# Patient Record
Sex: Male | Born: 1935 | Race: White | Hispanic: No | Marital: Married
Health system: Southern US, Community
[De-identification: ages and names within clinical notes are randomized; demographics above are authoritative.]

---

## 1999-08-10 ENCOUNTER — Ambulatory Visit (HOSPITAL_COMMUNITY): Admission: RE | Admit: 1999-08-10 | Discharge: 1999-08-10 | Payer: Self-pay | Admitting: *Deleted

## 1999-08-16 ENCOUNTER — Encounter: Payer: Self-pay | Admitting: *Deleted

## 1999-08-16 ENCOUNTER — Ambulatory Visit (HOSPITAL_COMMUNITY): Admission: RE | Admit: 1999-08-16 | Discharge: 1999-08-16 | Payer: Self-pay | Admitting: *Deleted

## 2004-01-12 ENCOUNTER — Ambulatory Visit: Payer: Self-pay | Admitting: Internal Medicine

## 2004-01-13 ENCOUNTER — Ambulatory Visit: Payer: Self-pay | Admitting: Internal Medicine

## 2004-02-04 ENCOUNTER — Encounter: Payer: Self-pay | Admitting: Internal Medicine

## 2004-02-16 ENCOUNTER — Encounter: Admission: RE | Admit: 2004-02-16 | Discharge: 2004-02-16 | Payer: Self-pay | Admitting: Urology

## 2004-06-15 ENCOUNTER — Encounter: Payer: Self-pay | Admitting: Internal Medicine

## 2004-06-29 ENCOUNTER — Ambulatory Visit: Payer: Self-pay | Admitting: Internal Medicine

## 2004-07-23 ENCOUNTER — Ambulatory Visit: Payer: Self-pay | Admitting: Internal Medicine

## 2005-03-01 ENCOUNTER — Ambulatory Visit: Payer: Self-pay | Admitting: Internal Medicine

## 2005-03-17 ENCOUNTER — Ambulatory Visit: Payer: Self-pay | Admitting: Internal Medicine

## 2006-02-06 ENCOUNTER — Encounter (INDEPENDENT_AMBULATORY_CARE_PROVIDER_SITE_OTHER): Payer: Self-pay | Admitting: *Deleted

## 2006-05-16 ENCOUNTER — Encounter: Payer: Self-pay | Admitting: Internal Medicine

## 2006-08-24 ENCOUNTER — Ambulatory Visit: Payer: Self-pay | Admitting: Internal Medicine

## 2006-08-24 DIAGNOSIS — R634 Abnormal weight loss: Secondary | ICD-10-CM

## 2006-08-24 DIAGNOSIS — I10 Essential (primary) hypertension: Secondary | ICD-10-CM | POA: Insufficient documentation

## 2006-08-24 DIAGNOSIS — E785 Hyperlipidemia, unspecified: Secondary | ICD-10-CM

## 2006-08-24 DIAGNOSIS — E119 Type 2 diabetes mellitus without complications: Secondary | ICD-10-CM

## 2006-08-24 DIAGNOSIS — F528 Other sexual dysfunction not due to a substance or known physiological condition: Secondary | ICD-10-CM

## 2006-08-24 DIAGNOSIS — I2510 Atherosclerotic heart disease of native coronary artery without angina pectoris: Secondary | ICD-10-CM | POA: Insufficient documentation

## 2006-08-25 ENCOUNTER — Ambulatory Visit: Payer: Self-pay | Admitting: Internal Medicine

## 2006-08-26 ENCOUNTER — Encounter: Payer: Self-pay | Admitting: Internal Medicine

## 2006-08-28 LAB — CONVERTED CEMR LAB
ALT: 13 units/L (ref 0–53)
AST: 22 units/L (ref 0–37)
Basophils Absolute: 0 10*3/uL (ref 0.0–0.1)
Chloride: 104 meq/L (ref 96–112)
Cholesterol: 124 mg/dL (ref 0–200)
Creatinine, Ser: 1.1 mg/dL (ref 0.4–1.5)
Eosinophils Relative: 2.4 % (ref 0.0–5.0)
Glucose, Bld: 137 mg/dL — ABNORMAL HIGH (ref 70–99)
HCT: 46.6 % (ref 39.0–52.0)
Hemoglobin: 15.3 g/dL (ref 13.0–17.0)
Hgb A1c MFr Bld: 6.4 % — ABNORMAL HIGH (ref 4.6–6.0)
LDL Cholesterol: 77 mg/dL (ref 0–99)
MCHC: 32.9 g/dL (ref 30.0–36.0)
MCV: 92.8 fL (ref 78.0–100.0)
Monocytes Absolute: 0.5 10*3/uL (ref 0.2–0.7)
Neutrophils Relative %: 57.2 % (ref 43.0–77.0)
Potassium: 4.4 meq/L (ref 3.5–5.1)
RBC: 5.02 M/uL (ref 4.22–5.81)
RDW: 12.5 % (ref 11.5–14.6)
Sodium: 144 meq/L (ref 135–145)
TSH: 1.65 microintl units/mL (ref 0.35–5.50)
Total CHOL/HDL Ratio: 3.6
WBC: 5.4 10*3/uL (ref 4.5–10.5)

## 2006-09-06 ENCOUNTER — Telehealth (INDEPENDENT_AMBULATORY_CARE_PROVIDER_SITE_OTHER): Payer: Self-pay | Admitting: *Deleted

## 2006-10-05 ENCOUNTER — Telehealth (INDEPENDENT_AMBULATORY_CARE_PROVIDER_SITE_OTHER): Payer: Self-pay | Admitting: *Deleted

## 2006-10-18 ENCOUNTER — Ambulatory Visit: Payer: Self-pay | Admitting: Internal Medicine

## 2006-10-23 LAB — CONVERTED CEMR LAB
BUN: 20 mg/dL (ref 6–23)
Calcium: 9.2 mg/dL (ref 8.4–10.5)
GFR calc Af Amer: 84 mL/min
GFR calc non Af Amer: 70 mL/min
Potassium: 4.5 meq/L (ref 3.5–5.1)

## 2007-01-17 ENCOUNTER — Ambulatory Visit: Payer: Self-pay | Admitting: Internal Medicine

## 2007-01-17 DIAGNOSIS — C61 Malignant neoplasm of prostate: Secondary | ICD-10-CM | POA: Insufficient documentation

## 2007-01-22 LAB — CONVERTED CEMR LAB
AST: 19 units/L (ref 0–37)
Albumin: 4.1 g/dL (ref 3.5–5.2)
Alkaline Phosphatase: 76 units/L (ref 39–117)
Amylase: 87 units/L (ref 27–131)
Basophils Absolute: 0 10*3/uL (ref 0.0–0.1)
Basophils Relative: 0.3 % (ref 0.0–1.0)
CO2: 32 meq/L (ref 19–32)
Chloride: 101 meq/L (ref 96–112)
Creatinine, Ser: 1.2 mg/dL (ref 0.4–1.5)
Creatinine,U: 124.6 mg/dL
Eosinophils Relative: 2 % (ref 0.0–5.0)
HCT: 45.1 % (ref 39.0–52.0)
Hemoglobin: 15.4 g/dL (ref 13.0–17.0)
Iron: 82 ug/dL (ref 42–165)
MCHC: 34 g/dL (ref 30.0–36.0)
Neutrophils Relative %: 59.7 % (ref 43.0–77.0)
RBC: 4.91 M/uL (ref 4.22–5.81)
RDW: 12.6 % (ref 11.5–14.6)
Sodium: 141 meq/L (ref 135–145)
TSH: 1.7 microintl units/mL (ref 0.35–5.50)
Total Bilirubin: 0.9 mg/dL (ref 0.3–1.2)
Total Protein: 7.2 g/dL (ref 6.0–8.3)
Transferrin: 260.8 mg/dL (ref >=212.0)
WBC: 5.7 10*3/uL (ref 4.5–10.5)

## 2007-01-30 ENCOUNTER — Ambulatory Visit: Admission: RE | Admit: 2007-01-30 | Discharge: 2007-02-07 | Payer: Self-pay | Admitting: Radiation Oncology

## 2007-02-05 ENCOUNTER — Ambulatory Visit: Payer: Self-pay | Admitting: Internal Medicine

## 2007-02-05 ENCOUNTER — Telehealth (INDEPENDENT_AMBULATORY_CARE_PROVIDER_SITE_OTHER): Payer: Self-pay | Admitting: *Deleted

## 2007-02-08 ENCOUNTER — Ambulatory Visit: Admission: RE | Admit: 2007-02-08 | Discharge: 2007-05-09 | Payer: Self-pay | Admitting: Radiation Oncology

## 2007-02-09 ENCOUNTER — Telehealth (INDEPENDENT_AMBULATORY_CARE_PROVIDER_SITE_OTHER): Payer: Self-pay | Admitting: *Deleted

## 2007-02-09 ENCOUNTER — Encounter: Payer: Self-pay | Admitting: Internal Medicine

## 2007-02-22 ENCOUNTER — Telehealth: Payer: Self-pay | Admitting: Internal Medicine

## 2007-03-07 ENCOUNTER — Telehealth: Payer: Self-pay | Admitting: Internal Medicine

## 2007-03-23 ENCOUNTER — Encounter: Payer: Self-pay | Admitting: Internal Medicine

## 2007-03-29 ENCOUNTER — Ambulatory Visit (HOSPITAL_COMMUNITY): Admission: RE | Admit: 2007-03-29 | Discharge: 2007-03-29 | Payer: Self-pay | Admitting: Internal Medicine

## 2007-04-05 ENCOUNTER — Encounter: Payer: Self-pay | Admitting: Internal Medicine

## 2007-04-12 ENCOUNTER — Encounter: Payer: Self-pay | Admitting: Internal Medicine

## 2007-04-20 ENCOUNTER — Telehealth (INDEPENDENT_AMBULATORY_CARE_PROVIDER_SITE_OTHER): Payer: Self-pay | Admitting: *Deleted

## 2007-04-27 ENCOUNTER — Ambulatory Visit: Payer: Self-pay | Admitting: Internal Medicine

## 2007-04-27 DIAGNOSIS — F068 Other specified mental disorders due to known physiological condition: Secondary | ICD-10-CM

## 2007-04-30 ENCOUNTER — Encounter: Payer: Self-pay | Admitting: Internal Medicine

## 2007-05-10 ENCOUNTER — Ambulatory Visit: Admission: RE | Admit: 2007-05-10 | Discharge: 2007-06-24 | Payer: Self-pay | Admitting: Radiation Oncology

## 2007-07-03 ENCOUNTER — Encounter (INDEPENDENT_AMBULATORY_CARE_PROVIDER_SITE_OTHER): Payer: Self-pay | Admitting: *Deleted

## 2007-07-11 ENCOUNTER — Encounter: Payer: Self-pay | Admitting: Internal Medicine

## 2007-08-08 ENCOUNTER — Encounter: Payer: Self-pay | Admitting: Internal Medicine

## 2007-09-11 ENCOUNTER — Telehealth (INDEPENDENT_AMBULATORY_CARE_PROVIDER_SITE_OTHER): Payer: Self-pay | Admitting: *Deleted

## 2007-09-12 ENCOUNTER — Ambulatory Visit: Payer: Self-pay | Admitting: Internal Medicine

## 2007-09-13 ENCOUNTER — Encounter (INDEPENDENT_AMBULATORY_CARE_PROVIDER_SITE_OTHER): Payer: Self-pay | Admitting: *Deleted

## 2007-09-13 LAB — CONVERTED CEMR LAB
Calcium: 9.5 mg/dL (ref 8.4–10.5)
Creatinine, Ser: 1.3 mg/dL (ref 0.4–1.5)
Creatinine,U: 142.3 mg/dL
GFR calc non Af Amer: 57 mL/min

## 2007-11-16 ENCOUNTER — Ambulatory Visit: Payer: Self-pay | Admitting: Internal Medicine

## 2007-11-26 ENCOUNTER — Telehealth (INDEPENDENT_AMBULATORY_CARE_PROVIDER_SITE_OTHER): Payer: Self-pay | Admitting: *Deleted

## 2008-01-14 ENCOUNTER — Encounter: Payer: Self-pay | Admitting: Internal Medicine

## 2008-03-17 ENCOUNTER — Telehealth (INDEPENDENT_AMBULATORY_CARE_PROVIDER_SITE_OTHER): Payer: Self-pay | Admitting: *Deleted

## 2008-03-26 ENCOUNTER — Ambulatory Visit: Payer: Self-pay | Admitting: Internal Medicine

## 2008-03-26 ENCOUNTER — Telehealth (INDEPENDENT_AMBULATORY_CARE_PROVIDER_SITE_OTHER): Payer: Self-pay | Admitting: *Deleted

## 2008-03-26 DIAGNOSIS — K4090 Unilateral inguinal hernia, without obstruction or gangrene, not specified as recurrent: Secondary | ICD-10-CM | POA: Insufficient documentation

## 2008-03-27 ENCOUNTER — Encounter (INDEPENDENT_AMBULATORY_CARE_PROVIDER_SITE_OTHER): Payer: Self-pay | Admitting: *Deleted

## 2008-03-31 ENCOUNTER — Ambulatory Visit: Payer: Self-pay | Admitting: Cardiovascular Disease

## 2008-03-31 ENCOUNTER — Encounter: Payer: Self-pay | Admitting: Internal Medicine

## 2008-04-02 ENCOUNTER — Ambulatory Visit: Payer: Self-pay

## 2008-04-02 ENCOUNTER — Encounter: Payer: Self-pay | Admitting: Internal Medicine

## 2008-04-16 ENCOUNTER — Ambulatory Visit (HOSPITAL_COMMUNITY): Admission: RE | Admit: 2008-04-16 | Discharge: 2008-04-17 | Payer: Self-pay | Admitting: Surgery

## 2008-07-12 IMAGING — CR DG CHEST 2V
1 series · 1 of 1 positions shown · non-contrast
Comparison: 05/14/2003

CLINICAL DATA: Weight loss.  
 CHEST - 2 VIEW:

[view not recorded]
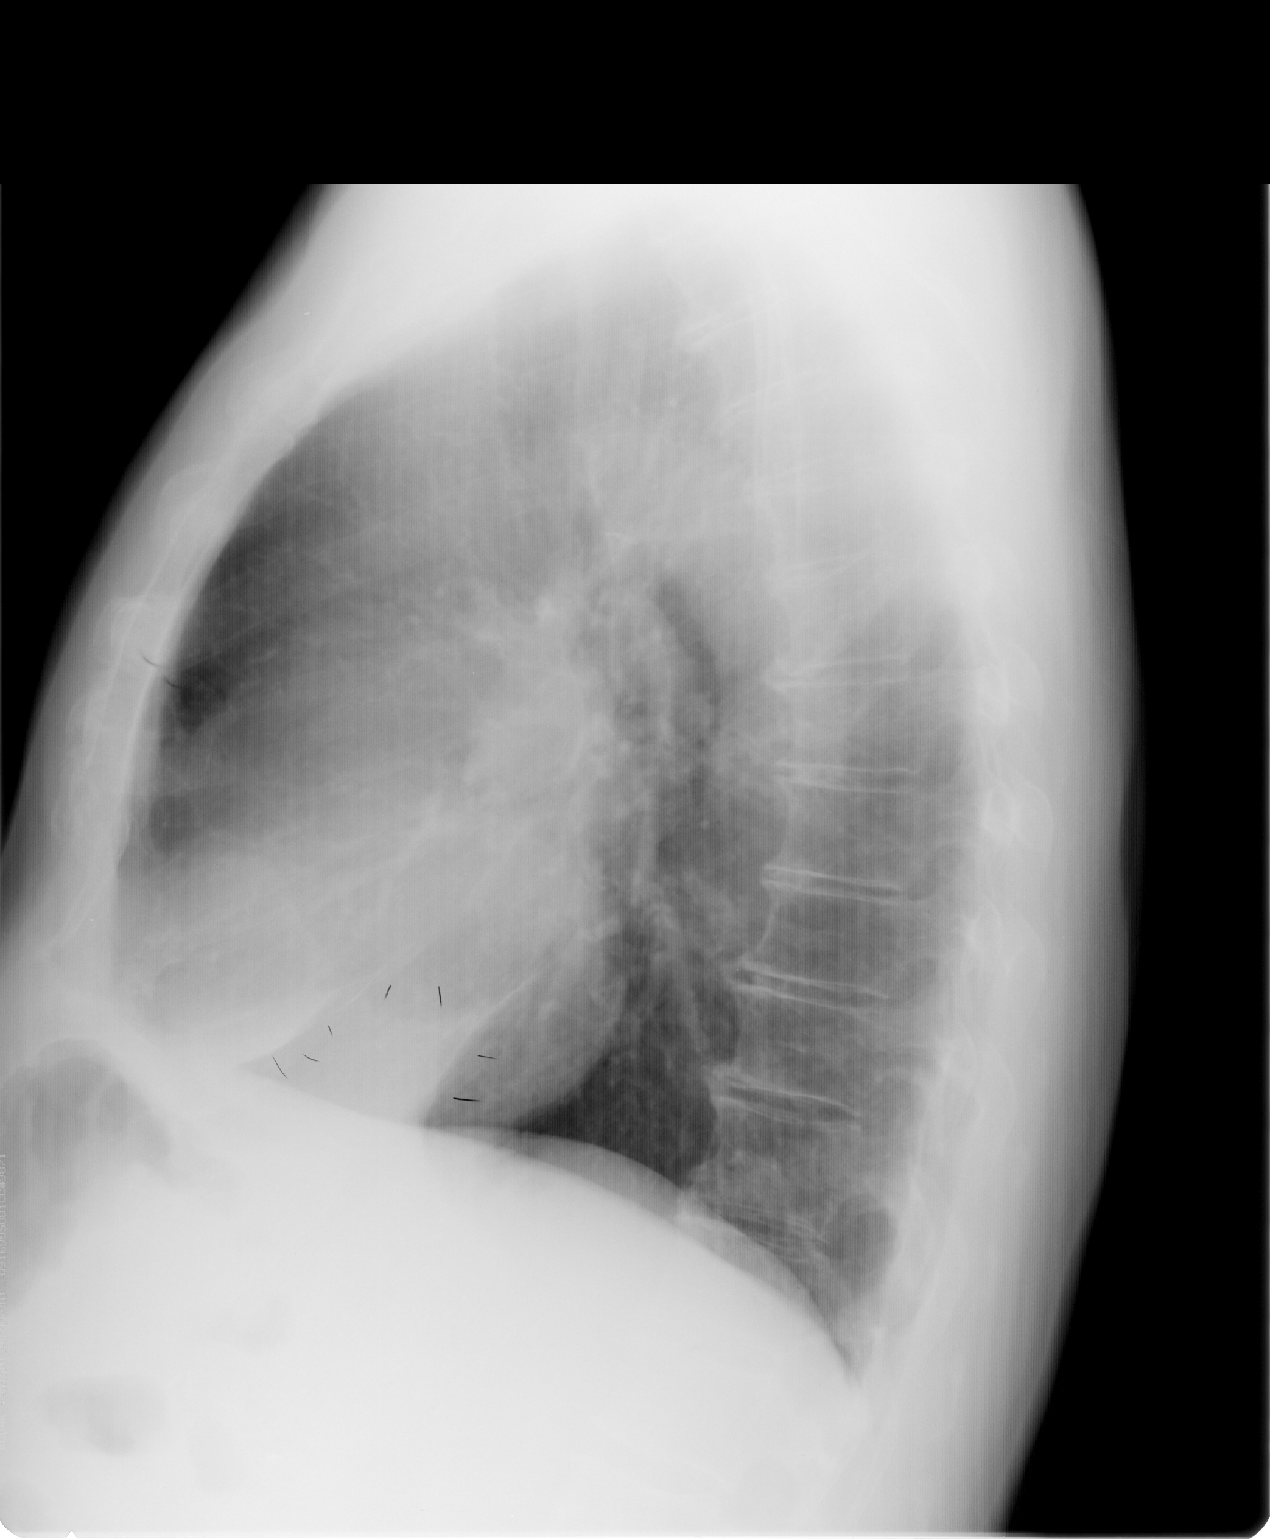

[1 of 1 positions shown; findings below may reference images not displayed]

FINDINGS: The heart size is mildly enlarged.  There is no pleural fluid or pulmonary edema. 
 Mild interstitial change consistent with COPD.  
 No focal airspace opacities are identified.  
 Review of the visualized osseous structures significant for multilevel thoracic disk space narrowing and anterior spur formation.
IMPRESSION: 1.  No evidence for thoracic malignancy.  
 2.   Thoracic spondylosis.

## 2008-09-09 ENCOUNTER — Telehealth: Payer: Self-pay | Admitting: Internal Medicine

## 2008-10-28 ENCOUNTER — Telehealth: Payer: Self-pay | Admitting: Internal Medicine

## 2008-11-05 ENCOUNTER — Ambulatory Visit: Payer: Self-pay | Admitting: Internal Medicine

## 2008-11-06 ENCOUNTER — Encounter: Payer: Self-pay | Admitting: Internal Medicine

## 2008-11-07 ENCOUNTER — Encounter (INDEPENDENT_AMBULATORY_CARE_PROVIDER_SITE_OTHER): Payer: Self-pay | Admitting: *Deleted

## 2008-11-13 LAB — CONVERTED CEMR LAB
Creatinine,U: 38.7 mg/dL
GFR calc non Af Amer: 56.97 mL/min (ref >60)
HCT: 42.9 % (ref 39.0–52.0)
Hemoglobin: 14.6 g/dL (ref 13.0–17.0)
Microalb Creat Ratio: 2602.1 mg/g — ABNORMAL HIGH (ref 0.0–30.0)
Microalb, Ur: 100.7 mg/dL — ABNORMAL HIGH (ref 0.0–1.9)
Platelets: 187 10*3/uL (ref 150.0–400.0)
Potassium: 4.5 meq/L (ref 3.5–5.1)
RBC: 4.68 M/uL (ref 4.22–5.81)
Sodium: 142 meq/L (ref 135–145)
WBC: 5.9 10*3/uL (ref 4.5–10.5)

## 2008-12-15 ENCOUNTER — Ambulatory Visit: Payer: Self-pay | Admitting: Internal Medicine

## 2008-12-17 LAB — CONVERTED CEMR LAB
CO2: 31 meq/L (ref 19–32)
GFR calc non Af Amer: 62.47 mL/min (ref >60)
Glucose, Bld: 119 mg/dL — ABNORMAL HIGH (ref 70–99)
Potassium: 4.1 meq/L (ref 3.5–5.1)
Sodium: 143 meq/L (ref 135–145)

## 2009-05-08 ENCOUNTER — Telehealth (INDEPENDENT_AMBULATORY_CARE_PROVIDER_SITE_OTHER): Payer: Self-pay | Admitting: *Deleted

## 2009-05-22 ENCOUNTER — Telehealth: Payer: Self-pay | Admitting: Internal Medicine

## 2009-05-22 ENCOUNTER — Encounter (INDEPENDENT_AMBULATORY_CARE_PROVIDER_SITE_OTHER): Payer: Self-pay | Admitting: *Deleted

## 2009-06-15 ENCOUNTER — Encounter (INDEPENDENT_AMBULATORY_CARE_PROVIDER_SITE_OTHER): Payer: Self-pay | Admitting: *Deleted

## 2009-07-21 ENCOUNTER — Ambulatory Visit: Payer: Self-pay | Admitting: Internal Medicine

## 2009-07-24 ENCOUNTER — Telehealth (INDEPENDENT_AMBULATORY_CARE_PROVIDER_SITE_OTHER): Payer: Self-pay | Admitting: *Deleted

## 2009-07-24 DIAGNOSIS — N19 Unspecified kidney failure: Secondary | ICD-10-CM | POA: Insufficient documentation

## 2009-07-24 LAB — CONVERTED CEMR LAB
AST: 14 units/L (ref 0–37)
CO2: 32 meq/L (ref 19–32)
Chloride: 102 meq/L (ref 96–112)
Cholesterol: 134 mg/dL (ref 0–200)
Creatinine, Ser: 1.7 mg/dL — ABNORMAL HIGH (ref 0.4–1.5)
Eosinophils Relative: 2.9 % (ref 0.0–5.0)
HCT: 39.9 % (ref 39.0–52.0)
Lymphocytes Relative: 24.1 % (ref 12.0–46.0)
Monocytes Relative: 11.3 % (ref 3.0–12.0)
Neutrophils Relative %: 61 % (ref 43.0–77.0)
Platelets: 223 10*3/uL (ref 150.0–400.0)
WBC: 4.9 10*3/uL (ref 4.5–10.5)

## 2009-07-29 ENCOUNTER — Encounter: Admission: RE | Admit: 2009-07-29 | Discharge: 2009-07-29 | Payer: Self-pay | Admitting: Internal Medicine

## 2009-07-30 ENCOUNTER — Telehealth (INDEPENDENT_AMBULATORY_CARE_PROVIDER_SITE_OTHER): Payer: Self-pay | Admitting: *Deleted

## 2009-11-02 ENCOUNTER — Telehealth: Payer: Self-pay | Admitting: Internal Medicine

## 2009-11-06 ENCOUNTER — Emergency Department (HOSPITAL_COMMUNITY): Admission: EM | Admit: 2009-11-06 | Discharge: 2009-11-06 | Payer: Self-pay | Admitting: Emergency Medicine

## 2009-11-06 ENCOUNTER — Ambulatory Visit: Payer: Self-pay | Admitting: Internal Medicine

## 2009-11-09 ENCOUNTER — Telehealth: Payer: Self-pay | Admitting: Internal Medicine

## 2009-11-13 ENCOUNTER — Encounter: Payer: Self-pay | Admitting: Internal Medicine

## 2009-11-23 ENCOUNTER — Ambulatory Visit: Payer: Self-pay | Admitting: Internal Medicine

## 2009-11-25 ENCOUNTER — Encounter: Payer: Self-pay | Admitting: Internal Medicine

## 2009-12-01 ENCOUNTER — Encounter: Payer: Self-pay | Admitting: Internal Medicine

## 2009-12-09 ENCOUNTER — Encounter: Payer: Self-pay | Admitting: Internal Medicine

## 2009-12-11 ENCOUNTER — Encounter: Payer: Self-pay | Admitting: Internal Medicine

## 2009-12-20 ENCOUNTER — Emergency Department (HOSPITAL_COMMUNITY)
Admission: EM | Admit: 2009-12-20 | Discharge: 2009-12-20 | Payer: Self-pay | Source: Home / Self Care | Admitting: Emergency Medicine

## 2009-12-21 ENCOUNTER — Telehealth: Payer: Self-pay | Admitting: Internal Medicine

## 2009-12-22 ENCOUNTER — Encounter: Payer: Self-pay | Admitting: Internal Medicine

## 2009-12-23 ENCOUNTER — Telehealth: Payer: Self-pay | Admitting: Internal Medicine

## 2009-12-25 ENCOUNTER — Telehealth: Payer: Self-pay | Admitting: Internal Medicine

## 2009-12-25 ENCOUNTER — Encounter: Payer: Self-pay | Admitting: Internal Medicine

## 2009-12-30 ENCOUNTER — Telehealth: Payer: Self-pay | Admitting: Internal Medicine

## 2010-01-04 ENCOUNTER — Telehealth: Payer: Self-pay | Admitting: Internal Medicine

## 2010-01-06 ENCOUNTER — Telehealth: Payer: Self-pay | Admitting: Internal Medicine

## 2010-01-08 ENCOUNTER — Telehealth (INDEPENDENT_AMBULATORY_CARE_PROVIDER_SITE_OTHER): Payer: Self-pay | Admitting: *Deleted

## 2010-01-21 ENCOUNTER — Encounter: Payer: Self-pay | Admitting: Internal Medicine

## 2010-02-28 ENCOUNTER — Encounter: Payer: Self-pay | Admitting: Internal Medicine
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
## 2010-03-09 NOTE — Miscellaneous (Signed)
Summary: Physicians Move In Orders & Physical Exam & [facility]  Physicians Move In Orders & Physical Exam & [facility]   Imported By: Lanelle Bal 12/02/2009 10:09:43  _____________________________________________________________________  External Attachment:    Type:   Image     Comment:   External Document
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
## 2010-03-09 NOTE — Progress Notes (Signed)
Summary: Lorazepam  Phone Note Refill Request Call back at [phone] Message from:  Pharmacy on December 23, 2009 8:23 AM  Refills Requested: Medication #1:  Lorazepam 1mg  tablet, take 1 tab by mouth twice daily as needed   Supply Requested: 1 month   Notes: not listed in med list, only listed as 0.25mg  [facility]  Next Appointment Scheduled: none Initial call taken by: Harold Barban,  December 23, 2009 8:23 AM  Follow-up for Phone Call        We just sent script for Alprazolam, should the patient be on both Alprazolam and Lorazepam? Please advise. Lucious Groves CMA  December 23, 2009 9:34 AM   Additional Follow-up for Phone Call Additional follow up Details #1::        no, he needs to take either one or the other. I prefer Xanax as it  is short acting Additional Follow-up by: Jose E. Paz MD,  December 23, 2009 12:15 PM    Additional Follow-up for Phone Call Additional follow up Details #2::    Notified nurse at [facility]. Ativan will be d/c. Follow-up by: Lucious Groves CMA,  December 23, 2009 2:41 PM

## 2010-03-09 NOTE — Assessment & Plan Note (Signed)
Summary: TB TEST/KN   Nurse Visit  CC: TB skin test./kb   Allergies: 1)  Coreg (Carvedilol)  Immunizations Administered:  PPD Skin Test:    Vaccine Type: PPD    Site: left forearm    Mfr: Sanofi Pasteur    Dose: 0.1 ml    Route: ID    Given by: Lucious Groves CMA    Exp. Date: 12/10/2010    Lot #: [id number]  PPD Results    Date of reading: 11/25/2009    Results: < 5mm    Interpretation: negative  Orders Added: 1)  TB Skin Test [86580] 2)  Admin 1st Vaccine [[address]]

## 2010-03-09 NOTE — Letter (Signed)
Summary: Florentina Addison at [facility]  [address] [place], [place] [address]   Phone: [phone]  Fax: [phone]    May 22, 2009  Chief [address] [place]  [address]  [place], [place] [address]  GE:[id number] DOB 10/17/1930  Juror: #______   Dear Milford Cage:   Bobbe Medico of  [facility] has just informed me that he has been chosen to serve on the jury.  Mr. Caul is a patient under my care with the diagnosos of Dementia. I do not feel that Mr. Lynnell Chad should serve on the jury. I would like to request that Mr. Hamm be excused from jury duty permanently.  Your consideration of this matter is greatly appreciated.  Respectfully,    Willow Ora, MD

## 2010-03-09 NOTE — Miscellaneous (Signed)
Summary: Patient Combative/[facility]  Patient Combative/[facility]   Imported By: Lanelle Bal 12/09/2009 12:23:18  _____________________________________________________________________  External Attachment:    Type:   Image     Comment:   External Document

## 2010-03-09 NOTE — Progress Notes (Signed)
Summary: Needs copy of cognitive test  Phone Note Call from Patient   Caller: Mrs Yetta Barre [phone] Summary of Call: Patient daughter left message that insurance company needs the cognitive test confirming that the patient has Alzheimers in order to pay out. They are requesting clinical proof (cognitive testing) confirming the pt diagnosis. She would like to know if this was done by our office or psychiatrist the patient was sent to. She did send a letter written by MD and that was not sufficient per the insurance company. There is currently a balance of $13,000. Please advise. Initial call taken by: Lucious Groves CMA,  January 06, 2010 2:59 PM  Follow-up for Phone Call        Patient daughter called back about the above. Please advise. Lucious Groves CMA,  January 07, 2010 10:09 AM  the patient is obviously demented, that has been documented repeatedly in my notes as well as in my letter from September 2010. He was also seen by neurology which confirmed a diagnosis. the family needs to find out exactly what they mean by "cognitive testing". if they  need some more formal "cognitive testing" he will need to be referred to psychology [place] E. Paz MD  January 07, 2010 1:09 PM   Additional Follow-up for Phone Call Additional follow up Details #1::         Patient daughter notified of the above and that a official cognitive test was not done b/c the patient was so obviously demented. She has spoken with the insurance company and they are persistent that he needs cognitive testing. I printed all office visit notes along with [facility] and Psych. Dr. Archer Asa notes for pick up. Patient daughter will stop by tomorrow to pick up these records.   Additional Follow-up by: Lucious Groves CMA,  January 07, 2010 3:02 PM

## 2010-03-09 NOTE — Assessment & Plan Note (Signed)
Summary: rto per dr/cbs   Vital Signs:  Patient profile:   77 year old male Height:      68 inches Weight:      144 pounds Temp:     98.6 degrees F oral Pulse rate:   72 / minute BP sitting:   140 / 58  (left arm)  Vitals Entered By: Jeremy Johann CMA (July 21, 2009 2:26 PM) CC: f/u  Comments --not fasting REVIEWED MED LIST, PATIENT AGREED DOSE AND INSTRUCTION CORRECT    History of Present Illness: ROV, here w/ daughter    Hyperlipidemia-- good medication compliance  Hypertension--no ambulatory BPs  Prostate Ca  -- no recent urology visit, some nocturia, no diff. voiding  Dementia-- slight decline, over all doing ok, see SH; he could benefit from some help though  Allergies: 1)  Coreg (Carvedilol)  Past History:  Past Medical History: Coronary artery disease Diabetes mellitus, type II Hyperlipidemia Hypertension Prostate Ca  s/p XRT 2009, seeds Dementia w/ some behavioral issues  Past Surgical History: Reviewed history from 08/24/2006 and no changes required. Cholecystectomy  Social History: Married moved to her daughter's place b/c his house has mold ADL--  unable to get showers, showering once q few weeks not cooking hard time using the  toilete  does not receive any help but the lady who help his wife occasionally brings assistance NOT driving   Review of Systems CV:  denies chest pain or shortness of breath No lower extremity edema. GI:  no nausea, vomiting, and diarrhea good appetite. GU:  no difficulty urinating, gross hematuria.  Physical Exam  General:  alert and well-developed.   Lungs:  normal respiratory effort, no intercostal retractions, no accessory muscle use, and normal breath sounds.   Heart:  normal rate, regular rhythm, no murmur, and no gallop.   Abdomen:  soft, non-tender, and no distention.   Extremities:  no pretibial edema bilaterally  Neurologic:  pleasent, NAD   Impression & Recommendations:  Problem # 1:  DEMENTIA  (ICD-294.8) stable  Orders: TLB-TSH (Thyroid Stimulating Hormone) (84443-TSH)  Problem # 2:  HYPERTENSION (ICD-401.9) at goal . labs  His updated medication list for this problem includes:    Metoprolol Tartrate 25 Mg Tabs (Metoprolol tartrate) .[name]... Take 1  tablet by mouth twice a day    Accuretic 20-12.5 Mg Tabs (Quinapril-hydrochlorothiazide) .[name]... Take 1 tablet by mouth twice a day    Losartan Potassium 50 Mg Tabs (Losartan potassium) .[name]... 1 by mouth once daily - due office visit in 3 weeks  BP today: 140/58 Prior BP: 160/82 (12/15/2008)  Labs Reviewed: K+: 4.1 (12/15/2008) Creat: : 1.2 (12/15/2008)   Chol: 124 (08/25/2006)   HDL: 34.1 (08/25/2006)   LDL: 77 (08/25/2006)   TG: 63 (08/25/2006)  Orders: Venipuncture ([address]) TLB-BMP (Basic Metabolic Panel-BMET) (80048-METABOL)  Problem # 3:  HYPERLIPIDEMIA (ICD-272.4) not fasting , check total chol, LFTs His updated medication list for this problem includes:    Lipitor 20 Mg Tabs (Atorvastatin calcium) .[name]... Take 1 tablet by mouth once a day  Labs Reviewed: SGOT: 18 (11/05/2008)   SGPT: 11 (11/05/2008)   HDL:34.1 (08/25/2006)  LDL:77 (08/25/2006)  Chol:124 (08/25/2006)  Trig:63 (08/25/2006)  Orders: TLB-ALT (SGPT) (84460-ALT) TLB-AST (SGOT) (84450-SGOT) TLB-Cholesterol, Total (82465-CHO)  Problem # 4:  DIABETES MELLITUS, TYPE II (ICD-250.00) labs  His updated medication list for this problem includes:    Accuretic 20-12.5 Mg Tabs (Quinapril-hydrochlorothiazide) .[name]... Take 1 tablet by mouth twice a day    Metformin Hcl 500 Mg  Tabs (Metformin hcl) .[name]... Take 2 tablet by mouth twice a day    Losartan Potassium 50 Mg Tabs (Losartan potassium) .[name]... 1 by mouth once daily - due office visit in 3 weeks    Labs Reviewed: Creat: 1.2 (12/15/2008)    Reviewed HgBA1c results: 6.8 (11/05/2008)  6.4 (09/12/2007)  Orders: TLB-A1C / Hgb A1C (Glycohemoglobin) (83036-A1C)  Problem # 5:  WEIGHT LOSS (ICD-783.21) still loosing  wt despite good appetite, likely multifactorial labs  Orders: TLB-CBC Platelet - w/Differential (85025-CBCD)  Problem # 6:  ADENOCARCINOMA, PROSTATE (ICD-185) rec to see urology  Complete Medication List: 1)  Flomax 0.4 Mg Cp24 (Tamsulosin hcl) .[name].. 1 capsule once a day - due office visit 2)  Lipitor 20 Mg Tabs (Atorvastatin calcium) .... Take 1 tablet by mouth once a day 3)  Metoprolol Tartrate 25 Mg Tabs (Metoprolol tartrate) .... Take 1  tablet by mouth twice a day 4)  Accuretic 20-12.5 Mg Tabs (Quinapril-hydrochlorothiazide) .... Take 1 tablet by mouth twice a day 5)  Metformin Hcl 500 Mg Tabs (Metformin hcl) .... Take 2 tablet by mouth twice a day 6)  Asa  7)  Aricept 10 Mg Tabs (Donepezil hcl) .[name].. 1 by mouth once daily 8)  Namenda 5 Mg Tabs (Memantine hcl) .[name].. 1 by mouth bid 9)  Losartan Potassium 50 Mg Tabs (Losartan potassium) .[name].. 1 by mouth once daily - due office visit in 3 weeks  Patient Instructions: 1)  Please schedule a follow-up appointment in 4 to 6  months .  Prescriptions: LOSARTAN POTASSIUM 50 MG TABS (LOSARTAN POTASSIUM) 1 by mouth once daily - DUE OFFICE VISIT IN 3 WEEKS  #30 x 6   Entered by:   Jeremy Johann CMA   Authorized by:   Nolon Rod. Staley Lunz MD   Signed by:   Jeremy Johann CMA on 07/21/2009   Method used:   [facility]ed to ...       [facility] ([facility]ail)       [address]       [place], [place]  [address]       Ph: [phone] or [phone]       Fax: [phone]   RxID:   [id number] NAMENDA 5 MG TABS (MEMANTINE HCL) 1 by mouth bid  #60 x 12   Entered by:   Jeremy Johann CMA   Authorized by:   Nolon Rod. Abeer Iversen MD   Signed by:   Jeremy Johann CMA on 07/21/2009   Method used:   [facility]axed to ...       [facility]  [place] (retail)       [address]       [place], [place]  [address]       Ph: [phone] or [phone]       Fax: [phone]   RxID:   [id number] ARICEPT 10 MG TABS (DONEPEZIL HCL) 1 by mouth once  daily  #30 Tablet x 12   Entered by:   Jeremy Johann CMA   Authorized by:   Nolon Rod. Georganna Maxson MD   Signed by:   Jeremy Johann CMA on 07/21/2009   Method used:   [facility]ed to ...       [facility]  [place] (retail)       [address]       [place], [place]  [address]       Ph: [phone] or [phone]       Fax: [phone]   RxID:   [id number] [facility]  0.4 MG CP24 (TAMSULOSIN HCL) 1 capsule once a day - DUE OFFICE VISIT  #30 x 0   Entered by:   Jeremy Johann CMA   Authorized by:   Nolon Rod. Iqra Rotundo MD   Signed by:   Jeremy Johann CMA on 07/21/2009   Method used:   [facility]ed to ...       [facility]  [place] (retail)       [address]       [place], [place]  [address]       Ph: [phone] or [phone]       Fax: [phone]   RxID:   [id number]

## 2010-03-09 NOTE — Miscellaneous (Signed)
Summary: Patient Having Outbursts/[facility]  Patient Having Outbursts/[facility]   Imported By: Lanelle Bal 01/01/2010 07:43:24  _____________________________________________________________________  External Attachment:    Type:   Image     Comment:   External Document

## 2010-03-09 NOTE — Miscellaneous (Signed)
Summary: FL2  [facility]   Imported By: Lanelle Bal 11/19/2009 13:52:56  _____________________________________________________________________  External Attachment:    Type:   Image     Comment:   External Document

## 2010-03-09 NOTE — Progress Notes (Signed)
Summary: letter request  Phone Note Call from Patient   Caller: Daughter--Betty [phone] until 5 pm Summary of Call: Patient daughter called stating that she needs a copy of the letter written for the [facility] faxed to her at Fax # [phone].   I do not see such a letter in the system, please advise. Initial call taken by: Lucious Groves CMA,  November 09, 2009 2:14 PM  Follow-up for Phone Call        see letter from 11-07-08, send her a copy Follow-up by: [facility] E. Paz MD,  November 10, 2009 12:51 PM  Additional Follow-up for Phone Call Additional follow up Details #1::        Kathie Rhodes notiifed and letter faxed. Additional Follow-up by: Lucious Groves CMA,  November 10, 2009 2:30 PM

## 2010-03-09 NOTE — Progress Notes (Signed)
Summary: change micardis, no answer 05/11/09  Phone Note Other Incoming   [facility] Summary of Call: Requesting change from Micardis to Losartan [facility]  May 08, 2009 1:10 PM   Follow-up for Phone Call        if the daughter is agreeable, changed to losartan 50 mg one p.o. daily. call #30 and 1 RF due for office visit, please arrange in 2 or 3 weeks Follow-up by: Jose E. Paz MD,  May 11, 2009 8:42 AM  Additional Follow-up for Phone Call Additional follow up Details #1::        no answer Shary Decamp  May 11, 2009 9:31 AM  discussed with wife -- she will have daughter call to arrange ov in 2-3 weeks Shary Decamp  May 13, 2009 1:27 PM      New/Updated Medications: LOSARTAN POTASSIUM 50 MG TABS (LOSARTAN POTASSIUM) 1 by mouth once daily - DUE OFFICE VISIT IN 3 WEEKS Prescriptions: LOSARTAN POTASSIUM 50 MG TABS (LOSARTAN POTASSIUM) 1 by mouth once daily - DUE OFFICE VISIT IN 3 WEEKS  #30 x 1   Entered by:   Shary Decamp   Authorized by:   Nolon Rod. Paz MD   Signed by:   Shary Decamp on 05/13/2009   Method used:   [facility]ically [facility]        [facility]  [place] (retail)       [address]       [place], [place]  [address]       Ph: [phone] or [phone]       Fax: [phone]   RxID:   [id number]
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
## 2010-03-09 NOTE — Progress Notes (Signed)
Summary: lab results  Phone Note Outgoing Call   Call placed by: [facility] CMA,  July 30, 2009 10:01 AM Details for Reason: u/s ok f/u in 4 months as planned , will recheck his kidney then Summary of Call: left message to call office................Marland KitchenFelecia Deloach CMA  July 30, 2009 10:01 AM
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
## 2010-03-09 NOTE — Miscellaneous (Signed)
Summary: Blood in Underwear/[facility]  Blood in [facility]   Imported By: Lanelle Bal 12/17/2009 13:16:00  _____________________________________________________________________  External Attachment:    Type:   Image     Comment:   External Document
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
## 2010-03-09 NOTE — Progress Notes (Signed)
Summary: lab results  Phone Note Outgoing Call   Call placed by: [facility] CMA,  July 24, 2009 4:34 PM Summary of Call: DISCUSS WITH PATIENT daughter...Marland KitchenMarland KitchenFelecia Deloach CMA  July 24, 2009 4:34 PM   New Problems: RENAL FAILURE (ICD-586)   New Problems: RENAL FAILURE (ICD-586)
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
## 2010-03-09 NOTE — Progress Notes (Signed)
Summary: Refill Request  Phone Note Refill Request Call back at [phone] Message from:  Pharmacy on January 04, 2010 10:53 AM  Refills Requested: Medication #1:  ALPRAZOLAM 0.25 MG TABS 1 po every 4 hours as needed agitation.   Dosage confirmed as above?Dosage Confirmed   Supply Requested: 1 month   Last Refilled: 12/22/2009 [facility]  Next Appointment Scheduled: none Initial call taken by: Harold Barban,  January 04, 2010 10:54 AM  Follow-up for Phone Call        You just denied this, do you want to deny again?  Follow-up by: Army Fossa CMA,  January 04, 2010 11:01 AM  Additional Follow-up for Phone Call Additional follow up Details #1::        he can take up to 6 a day so is ok to call  #40, no refills. Supposedly Dr. Redmond School  will follow him, if that is the case, they need to refill his medicines Additional Follow-up by: [facility] E. Taeler Winning MD,  January 04, 2010 5:09 PM    Prescriptions: ALPRAZOLAM 0.25 MG TABS (ALPRAZOLAM) 1 po every 4 hours as needed agitation  #40 x 0   Entered by:   Army Fossa CMA   Authorized by:   Nolon Rod. Glorianne Proctor MD   Signed by:   Army Fossa CMA on 01/05/2010   Method used:   Printed then faxed to ...       [facility] [address] (mail-order)       [address] [place], [place]  [address]       Ph: [phone]       Fax: [phone]   RxID:   [id number]
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
## 2010-03-09 NOTE — Letter (Signed)
Summary: [place] No Show Letter  [facility] [place]  [address] [place], [place] [address]   Phone: [phone]  Fax: [phone]    06/15/2009 MRN: [id number]  [facility] [address] [place], [place]  [address]   Dear Mr. Tenny,   Our records indicate that you missed your scheduled appointment with Dr. Drue Novel on 06/15/09.  Please contact this office to reschedule your appointment as soon as possible.  It is important that you keep your scheduled appointments with your physician, so we can provide you the best care possible.  Please be advised that there may be a charge for "no show" appointments.    Sincerely,    at [facility]

## 2010-03-09 NOTE — Progress Notes (Signed)
Summary: Refill  Phone Note Refill Request Call back at Phone: [phone] Message from:  Fax from Pharmacy on December 25, 2009 4:15 PM  Refills Requested: Medication #1:  ALPRAZOLAM 0.25 MG TABS 1 po every 4 hours as needed agitation.   Supply Requested: 6 months Prudencio Pair Fax: [phone]   Method Requested: Fax to Pharmacy Next Appointment Scheduled: No Future Appointments Initial call taken by: Barnie Mort,  December 25, 2009 4:17 PM  Follow-up for Phone Call        denied, I called #40 recently, I like to see how he does on xanax before I Rx a 6 mo supply Wiley Magan E. Hyden Soley MD  December 28, 2009 1:03 PM   Additional Follow-up for Phone Call Additional follow up Details #1::        Left detailed message on Nurses VM at [facility].  Additional Follow-up by: Army Fossa CMA,  December 28, 2009 2:38 PM

## 2010-03-11 NOTE — Miscellaneous (Signed)
Summary: Care Plan/[facility]   Imported By: Lanelle Bal 01/29/2010 14:04:36  _____________________________________________________________________  External Attachment:    Type:   Image     Comment:   External Document

## 2010-04-03 ENCOUNTER — Emergency Department (HOSPITAL_COMMUNITY): Payer: Medicare Other

## 2010-04-03 ENCOUNTER — Inpatient Hospital Stay (HOSPITAL_COMMUNITY)
Admission: EM | Admit: 2010-04-03 | Discharge: 2010-04-07 | DRG: 177 | Disposition: A | Discharge status: Skilled Nursing Facility | Payer: Medicare Other | Attending: Internal Medicine | Admitting: Internal Medicine

## 2010-04-03 DIAGNOSIS — A419 Sepsis, unspecified organism: Secondary | ICD-10-CM

## 2010-04-03 DIAGNOSIS — I672 Cerebral atherosclerosis: Secondary | ICD-10-CM | POA: Diagnosis present

## 2010-04-03 DIAGNOSIS — Z7982 Long term (current) use of aspirin: Secondary | ICD-10-CM

## 2010-04-03 DIAGNOSIS — G309 Alzheimer's disease, unspecified: Secondary | ICD-10-CM | POA: Diagnosis present

## 2010-04-03 DIAGNOSIS — I2510 Atherosclerotic heart disease of native coronary artery without angina pectoris: Secondary | ICD-10-CM | POA: Diagnosis present

## 2010-04-03 DIAGNOSIS — R6520 Severe sepsis without septic shock: Secondary | ICD-10-CM | POA: Diagnosis present

## 2010-04-03 DIAGNOSIS — J189 Pneumonia, unspecified organism: Secondary | ICD-10-CM | POA: Diagnosis present

## 2010-04-03 DIAGNOSIS — J9600 Acute respiratory failure, unspecified whether with hypoxia or hypercapnia: Secondary | ICD-10-CM

## 2010-04-03 DIAGNOSIS — N179 Acute kidney failure, unspecified: Secondary | ICD-10-CM | POA: Diagnosis present

## 2010-04-03 DIAGNOSIS — M6282 Rhabdomyolysis: Secondary | ICD-10-CM | POA: Diagnosis present

## 2010-04-03 DIAGNOSIS — E118 Type 2 diabetes mellitus with unspecified complications: Secondary | ICD-10-CM | POA: Diagnosis present

## 2010-04-03 DIAGNOSIS — D72829 Elevated white blood cell count, unspecified: Secondary | ICD-10-CM | POA: Diagnosis present

## 2010-04-03 DIAGNOSIS — I129 Hypertensive chronic kidney disease with stage 1 through stage 4 chronic kidney disease, or unspecified chronic kidney disease: Secondary | ICD-10-CM | POA: Diagnosis present

## 2010-04-03 DIAGNOSIS — R404 Transient alteration of awareness: Secondary | ICD-10-CM | POA: Diagnosis present

## 2010-04-03 DIAGNOSIS — J690 Pneumonitis due to inhalation of food and vomit: Principal | ICD-10-CM | POA: Diagnosis present

## 2010-04-03 DIAGNOSIS — IMO0002 Reserved for concepts with insufficient information to code with codable children: Secondary | ICD-10-CM | POA: Diagnosis present

## 2010-04-03 DIAGNOSIS — N189 Chronic kidney disease, unspecified: Secondary | ICD-10-CM | POA: Diagnosis present

## 2010-04-03 DIAGNOSIS — G9341 Metabolic encephalopathy: Secondary | ICD-10-CM | POA: Diagnosis present

## 2010-04-03 DIAGNOSIS — R6521 Severe sepsis with septic shock: Secondary | ICD-10-CM | POA: Diagnosis present

## 2010-04-03 DIAGNOSIS — Z8546 Personal history of malignant neoplasm of prostate: Secondary | ICD-10-CM

## 2010-04-03 DIAGNOSIS — E785 Hyperlipidemia, unspecified: Secondary | ICD-10-CM | POA: Diagnosis present

## 2010-04-03 DIAGNOSIS — E7800 Pure hypercholesterolemia, unspecified: Secondary | ICD-10-CM | POA: Diagnosis present

## 2010-04-03 DIAGNOSIS — Z66 Do not resuscitate: Secondary | ICD-10-CM | POA: Diagnosis present

## 2010-04-03 DIAGNOSIS — F0280 Dementia in other diseases classified elsewhere without behavioral disturbance: Secondary | ICD-10-CM | POA: Diagnosis present

## 2010-04-03 LAB — CBC
Hemoglobin: 11 g/dL — ABNORMAL LOW (ref 13.0–17.0)
MCH: 29.2 pg (ref 26.0–34.0)
MCV: 90.2 fL (ref 78.0–100.0)
RBC: 3.77 MIL/uL — ABNORMAL LOW (ref 4.22–5.81)

## 2010-04-03 LAB — URINE MICROSCOPIC-ADD ON

## 2010-04-03 LAB — POCT I-STAT 3, ART BLOOD GAS (G3+)
Bicarbonate: 29.3 mEq/L — ABNORMAL HIGH (ref 20.0–24.0)
O2 Saturation: 96 %
TCO2: 31 mmol/L (ref 0–100)
pCO2 arterial: 52.7 mmHg — ABNORMAL HIGH (ref 35.0–45.0)

## 2010-04-03 LAB — DIFFERENTIAL
Lymphocytes Relative: 8 % — ABNORMAL LOW (ref 12–46)
Lymphs Abs: 0.9 10*3/uL (ref 0.7–4.0)
Monocytes Relative: 9 % (ref 3–12)
Neutro Abs: 10.2 10*3/uL — ABNORMAL HIGH (ref 1.7–7.7)
Neutrophils Relative %: 83 % — ABNORMAL HIGH (ref 43–77)

## 2010-04-03 LAB — URINALYSIS, ROUTINE W REFLEX MICROSCOPIC
Bilirubin Urine: NEGATIVE
Specific Gravity, Urine: 1.02 (ref 1.005–1.030)
Urine Glucose, Fasting: 100 mg/dL — AB
Urobilinogen, UA: 0.2 mg/dL (ref 0.0–1.0)

## 2010-04-03 LAB — TSH: TSH: 1.76 u[IU]/mL (ref 0.350–4.500)

## 2010-04-03 LAB — BASIC METABOLIC PANEL
BUN: 47 mg/dL — ABNORMAL HIGH (ref 6–23)
CO2: 21 mEq/L (ref 19–32)
Chloride: 101 mEq/L (ref 96–112)
Creatinine, Ser: 2.19 mg/dL — ABNORMAL HIGH (ref 0.4–1.5)
Potassium: 3.9 mEq/L (ref 3.5–5.1)

## 2010-04-03 LAB — CK TOTAL AND CKMB (NOT AT ARMC)
CK, MB: 110 ng/mL (ref 0.3–4.0)
Total CK: 11793 U/L — ABNORMAL HIGH (ref 7–232)

## 2010-04-03 LAB — PROCALCITONIN: Procalcitonin: 2.2 ng/mL

## 2010-04-03 LAB — MRSA PCR SCREENING: MRSA by PCR: NEGATIVE

## 2010-04-03 LAB — LACTIC ACID, PLASMA: Lactic Acid, Venous: 1 mmol/L (ref 0.5–2.2)

## 2010-04-04 LAB — GLUCOSE, CAPILLARY: Glucose-Capillary: 141 mg/dL — ABNORMAL HIGH (ref 70–99)

## 2010-04-04 LAB — BASIC METABOLIC PANEL
BUN: 41 mg/dL — ABNORMAL HIGH (ref 6–23)
CO2: 26 mEq/L (ref 19–32)
Calcium: 8.2 mg/dL — ABNORMAL LOW (ref 8.4–10.5)
Creatinine, Ser: 1.78 mg/dL — ABNORMAL HIGH (ref 0.4–1.5)
Glucose, Bld: 95 mg/dL (ref 70–99)
Sodium: 148 mEq/L — ABNORMAL HIGH (ref 135–145)

## 2010-04-04 LAB — DIFFERENTIAL
Lymphocytes Relative: 8 % — ABNORMAL LOW (ref 12–46)
Lymphs Abs: 0.9 10*3/uL (ref 0.7–4.0)
Monocytes Absolute: 1.2 10*3/uL — ABNORMAL HIGH (ref 0.1–1.0)
Monocytes Relative: 11 % (ref 3–12)
Neutro Abs: 8.5 10*3/uL — ABNORMAL HIGH (ref 1.7–7.7)

## 2010-04-04 LAB — CBC
HCT: 34.4 % — ABNORMAL LOW (ref 39.0–52.0)
Hemoglobin: 10.9 g/dL — ABNORMAL LOW (ref 13.0–17.0)
MCH: 28.6 pg (ref 26.0–34.0)
MCHC: 31.7 g/dL (ref 30.0–36.0)
MCV: 90.3 fL (ref 78.0–100.0)

## 2010-04-04 LAB — URINE CULTURE: Culture  Setup Time: 201202252037

## 2010-04-04 LAB — HEMOCCULT GUIAC POC 1CARD (OFFICE): Fecal Occult Bld: POSITIVE

## 2010-04-04 LAB — CARDIAC PANEL(CRET KIN+CKTOT+MB+TROPI): Total CK: 8528 U/L — ABNORMAL HIGH (ref 7–232)

## 2010-04-05 ENCOUNTER — Inpatient Hospital Stay (HOSPITAL_COMMUNITY): Payer: Medicare Other

## 2010-04-05 LAB — GLUCOSE, CAPILLARY
Glucose-Capillary: 167 mg/dL — ABNORMAL HIGH (ref 70–99)
Glucose-Capillary: 229 mg/dL — ABNORMAL HIGH (ref 70–99)
Glucose-Capillary: 277 mg/dL — ABNORMAL HIGH (ref 70–99)

## 2010-04-05 LAB — CK TOTAL AND CKMB (NOT AT ARMC)
CK, MB: 6.7 ng/mL (ref 0.3–4.0)
Relative Index: 0.2 (ref 0.0–2.5)
Total CK: 2795 U/L — ABNORMAL HIGH (ref 7–232)

## 2010-04-06 LAB — GLUCOSE, CAPILLARY
Glucose-Capillary: 125 mg/dL — ABNORMAL HIGH (ref 70–99)
Glucose-Capillary: 172 mg/dL — ABNORMAL HIGH (ref 70–99)

## 2010-04-06 LAB — CBC
Platelets: 252 10*3/uL (ref 150–400)
RBC: 3.28 MIL/uL — ABNORMAL LOW (ref 4.22–5.81)
WBC: 7.3 10*3/uL (ref 4.0–10.5)

## 2010-04-06 LAB — RENAL FUNCTION PANEL
Albumin: 1.7 g/dL — ABNORMAL LOW (ref 3.5–5.2)
Chloride: 104 mEq/L (ref 96–112)
GFR calc Af Amer: 58 mL/min — ABNORMAL LOW (ref >60)
GFR calc non Af Amer: 48 mL/min — ABNORMAL LOW (ref >60)
Phosphorus: 2 mg/dL — ABNORMAL LOW (ref 2.3–4.6)
Potassium: 3.2 mEq/L — ABNORMAL LOW (ref 3.5–5.1)
Sodium: 141 mEq/L (ref 135–145)

## 2010-04-06 LAB — DIFFERENTIAL
Basophils Absolute: 0 10*3/uL (ref 0.0–0.1)
Basophils Relative: 0 % (ref 0–1)
Eosinophils Absolute: 0.1 10*3/uL (ref 0.0–0.7)
Lymphs Abs: 1.5 10*3/uL (ref 0.7–4.0)
Neutrophils Relative %: 68 % (ref 43–77)

## 2010-04-07 LAB — CBC
Hemoglobin: 10 g/dL — ABNORMAL LOW (ref 13.0–17.0)
MCHC: 32.6 g/dL (ref 30.0–36.0)
Platelets: 274 10*3/uL (ref 150–400)
RBC: 3.48 MIL/uL — ABNORMAL LOW (ref 4.22–5.81)

## 2010-04-07 LAB — CK: Total CK: 584 U/L — ABNORMAL HIGH (ref 7–232)

## 2010-04-07 LAB — GLUCOSE, CAPILLARY
Glucose-Capillary: 116 mg/dL — ABNORMAL HIGH (ref 70–99)
Glucose-Capillary: 213 mg/dL — ABNORMAL HIGH (ref 70–99)
Glucose-Capillary: 129 mg/dL — ABNORMAL HIGH (ref 70–99)

## 2010-04-07 LAB — BASIC METABOLIC PANEL
CO2: 28 mEq/L (ref 19–32)
Calcium: 7.9 mg/dL — ABNORMAL LOW (ref 8.4–10.5)
GFR calc Af Amer: >60 mL/min (ref >60)
Sodium: 138 mEq/L (ref 135–145)

## 2010-04-09 LAB — CULTURE, BLOOD (ROUTINE X 2): Culture  Setup Time: 201202252022

## 2010-04-20 LAB — COMPREHENSIVE METABOLIC PANEL
ALT: 14 U/L (ref 0–53)
Alkaline Phosphatase: 96 U/L (ref 39–117)
BUN: 26 mg/dL — ABNORMAL HIGH (ref 6–23)
CO2: 29 mEq/L (ref 19–32)
Chloride: 101 mEq/L (ref 96–112)
GFR calc non Af Amer: 44 mL/min — ABNORMAL LOW (ref >60)
Glucose, Bld: 135 mg/dL — ABNORMAL HIGH (ref 70–99)
Potassium: 4.5 mEq/L (ref 3.5–5.1)
Sodium: 138 mEq/L (ref 135–145)
Total Bilirubin: 0.6 mg/dL (ref 0.3–1.2)

## 2010-04-20 LAB — ETHANOL: Alcohol, Ethyl (B): <5 mg/dL (ref 0–10)

## 2010-04-20 LAB — URINE CULTURE
Colony Count: NO GROWTH
Culture  Setup Time: 201111132358

## 2010-04-20 LAB — URINALYSIS, ROUTINE W REFLEX MICROSCOPIC
Nitrite: NEGATIVE
Protein, ur: 100 mg/dL — AB
Specific Gravity, Urine: 1.008 (ref 1.005–1.030)
Urobilinogen, UA: 0.2 mg/dL (ref 0.0–1.0)

## 2010-04-20 LAB — DIFFERENTIAL
Basophils Absolute: 0 10*3/uL (ref 0.0–0.1)
Basophils Relative: 0 % (ref 0–1)
Eosinophils Absolute: 0.1 10*3/uL (ref 0.0–0.7)
Monocytes Relative: 7 % (ref 3–12)
Neutro Abs: 5 10*3/uL (ref 1.7–7.7)
Neutrophils Relative %: 76 % (ref 43–77)

## 2010-04-20 LAB — CBC
HCT: 39.7 % (ref 39.0–52.0)
Hemoglobin: 13.6 g/dL (ref 13.0–17.0)
MCV: 89.6 fL (ref 78.0–100.0)
RBC: 4.43 MIL/uL (ref 4.22–5.81)
RDW: 13.1 % (ref 11.5–15.5)
WBC: 6.7 10*3/uL (ref 4.0–10.5)

## 2010-04-20 LAB — URINE MICROSCOPIC-ADD ON

## 2010-04-22 LAB — URINALYSIS, ROUTINE W REFLEX MICROSCOPIC
Ketones, ur: NEGATIVE mg/dL
Leukocytes, UA: NEGATIVE
Nitrite: NEGATIVE
Protein, ur: >300 mg/dL — AB
pH: 6 (ref 5.0–8.0)

## 2010-04-22 LAB — COMPREHENSIVE METABOLIC PANEL
AST: 20 U/L (ref 0–37)
Albumin: 3.4 g/dL — ABNORMAL LOW (ref 3.5–5.2)
Calcium: 8.7 mg/dL (ref 8.4–10.5)
Creatinine, Ser: 1.68 mg/dL — ABNORMAL HIGH (ref 0.4–1.5)
GFR calc Af Amer: 48 mL/min — ABNORMAL LOW (ref >60)
GFR calc non Af Amer: 40 mL/min — ABNORMAL LOW (ref >60)
Sodium: 142 mEq/L (ref 135–145)
Total Protein: 6.5 g/dL (ref 6.0–8.3)

## 2010-04-22 LAB — DIFFERENTIAL
Basophils Relative: 0 % (ref 0–1)
Monocytes Absolute: 0.3 10*3/uL (ref 0.1–1.0)
Monocytes Relative: 6 % (ref 3–12)
Neutro Abs: 4.2 10*3/uL (ref 1.7–7.7)

## 2010-04-22 LAB — CBC
MCH: 31 pg (ref 26.0–34.0)
MCHC: 34 g/dL (ref 30.0–36.0)
Platelets: 205 10*3/uL (ref 150–400)
RDW: 13.3 % (ref 11.5–15.5)

## 2010-04-22 LAB — URINE MICROSCOPIC-ADD ON

## 2010-05-20 LAB — COMPREHENSIVE METABOLIC PANEL
BUN: 40 mg/dL — ABNORMAL HIGH (ref 6–23)
Calcium: 9 mg/dL (ref 8.4–10.5)
Glucose, Bld: 134 mg/dL — ABNORMAL HIGH (ref 70–99)
Sodium: 142 mEq/L (ref 135–145)
Total Protein: 7 g/dL (ref 6.0–8.3)

## 2010-05-20 LAB — CBC
HCT: 40.1 % (ref 39.0–52.0)
Hemoglobin: 13.5 g/dL (ref 13.0–17.0)
MCHC: 33.7 g/dL (ref 30.0–36.0)
RDW: 13.3 % (ref 11.5–15.5)

## 2010-05-20 LAB — DIFFERENTIAL
Lymphocytes Relative: 18 % (ref 12–46)
Lymphs Abs: 1 10*3/uL (ref 0.7–4.0)
Monocytes Relative: 10 % (ref 3–12)
Neutro Abs: 4 10*3/uL (ref 1.7–7.7)
Neutrophils Relative %: 71 % (ref 43–77)

## 2010-05-20 LAB — GLUCOSE, CAPILLARY

## 2010-06-15 ENCOUNTER — Emergency Department (HOSPITAL_COMMUNITY): Payer: Medicare Other

## 2010-06-15 ENCOUNTER — Encounter: Payer: Self-pay | Admitting: Internal Medicine

## 2010-06-15 ENCOUNTER — Inpatient Hospital Stay (HOSPITAL_COMMUNITY)
Admission: EM | Admit: 2010-06-15 | Discharge: 2010-06-28 | DRG: 481 | Disposition: A | Discharge status: Skilled Nursing Facility | Payer: Medicare Other | Attending: Internal Medicine | Admitting: Internal Medicine

## 2010-06-15 DIAGNOSIS — S72143A Displaced intertrochanteric fracture of unspecified femur, initial encounter for closed fracture: Principal | ICD-10-CM | POA: Diagnosis present

## 2010-06-15 DIAGNOSIS — Z515 Encounter for palliative care: Secondary | ICD-10-CM

## 2010-06-15 DIAGNOSIS — F0280 Dementia in other diseases classified elsewhere without behavioral disturbance: Secondary | ICD-10-CM | POA: Diagnosis present

## 2010-06-15 DIAGNOSIS — K625 Hemorrhage of anus and rectum: Secondary | ICD-10-CM | POA: Diagnosis not present

## 2010-06-15 DIAGNOSIS — IMO0002 Reserved for concepts with insufficient information to code with codable children: Secondary | ICD-10-CM | POA: Diagnosis not present

## 2010-06-15 DIAGNOSIS — G309 Alzheimer's disease, unspecified: Secondary | ICD-10-CM | POA: Diagnosis present

## 2010-06-15 DIAGNOSIS — T45515A Adverse effect of anticoagulants, initial encounter: Secondary | ICD-10-CM | POA: Diagnosis not present

## 2010-06-15 DIAGNOSIS — R1310 Dysphagia, unspecified: Secondary | ICD-10-CM | POA: Diagnosis present

## 2010-06-15 DIAGNOSIS — E119 Type 2 diabetes mellitus without complications: Secondary | ICD-10-CM | POA: Diagnosis present

## 2010-06-15 DIAGNOSIS — N179 Acute kidney failure, unspecified: Secondary | ICD-10-CM | POA: Diagnosis present

## 2010-06-15 DIAGNOSIS — Z8546 Personal history of malignant neoplasm of prostate: Secondary | ICD-10-CM

## 2010-06-15 DIAGNOSIS — Z66 Do not resuscitate: Secondary | ICD-10-CM | POA: Diagnosis present

## 2010-06-15 DIAGNOSIS — E785 Hyperlipidemia, unspecified: Secondary | ICD-10-CM | POA: Diagnosis present

## 2010-06-15 DIAGNOSIS — N189 Chronic kidney disease, unspecified: Secondary | ICD-10-CM | POA: Diagnosis present

## 2010-06-15 DIAGNOSIS — W19XXXA Unspecified fall, initial encounter: Secondary | ICD-10-CM | POA: Diagnosis present

## 2010-06-15 DIAGNOSIS — S2249XA Multiple fractures of ribs, unspecified side, initial encounter for closed fracture: Secondary | ICD-10-CM | POA: Diagnosis present

## 2010-06-15 LAB — TYPE AND SCREEN: ABO/RH(D): O POS

## 2010-06-15 LAB — POCT I-STAT, CHEM 8
Chloride: 102 mEq/L (ref 96–112)
HCT: 37 % — ABNORMAL LOW (ref 39.0–52.0)
Potassium: 4 mEq/L (ref 3.5–5.1)

## 2010-06-15 LAB — CBC
MCH: 30.1 pg (ref 26.0–34.0)
Platelets: 226 10*3/uL (ref 150–400)
RBC: 3.69 MIL/uL — ABNORMAL LOW (ref 4.22–5.81)
RDW: 13.4 % (ref 11.5–15.5)
WBC: 6.9 10*3/uL (ref 4.0–10.5)

## 2010-06-15 LAB — DIFFERENTIAL
Basophils Relative: 0 % (ref 0–1)
Eosinophils Absolute: 0 10*3/uL (ref 0.0–0.7)
Eosinophils Relative: 0 % (ref 0–5)
Monocytes Relative: 10 % (ref 3–12)
Neutrophils Relative %: 78 % — ABNORMAL HIGH (ref 43–77)

## 2010-06-15 LAB — BASIC METABOLIC PANEL
Chloride: 102 mEq/L (ref 96–112)
Creatinine, Ser: 1.3 mg/dL (ref 0.4–1.5)
GFR calc Af Amer: >60 mL/min (ref >60)
GFR calc non Af Amer: 53 mL/min — ABNORMAL LOW (ref >60)

## 2010-06-15 LAB — PROTIME-INR
INR: 1.14 (ref 0.00–1.49)
Prothrombin Time: 14.8 seconds (ref 11.6–15.2)

## 2010-06-15 LAB — GLUCOSE, CAPILLARY: Glucose-Capillary: 160 mg/dL — ABNORMAL HIGH (ref 70–99)

## 2010-06-15 NOTE — H&P (Signed)
Hospital Admission Note Date: 06/15/2010  Patient name: [name] Medical record number: [id number] Date of birth: Aug 30, 1930 Age: 77 y.o. Gender: male PCP: Willow Ora, MD, MD  Medical Service:Internal Medicine  Attending physician: Dr. Anderson Malta     Pager: Resident [phone]): Dr. Baltazar Apo                 Pager: [phone] Resident (R1): Dr. Dorthula Rue                  Pager: [phone]  Chief Complaint: fall  History of Present Illness: 77 y/o man resident from [facility] states SNF with PMH significant for Type 2 DM, HTN, HLD, Alzheimer's dementia comes to the ER with chief complaint of fall. The history was obtained from the chart and daughters present at the bedside. Patient apparently had an witnessed fall while he was trying to dress up the morning of a day prior to his admission around  6.30 AM. The daughter who was visiting him noticed that he was not acting himself and was in some distress. She requested some imaging at the nursing home, but the report was not read until today when it showed left hip fracture and patient was brought to the ER for further management. As per the daughter's , his mental status is declining for last one month and he is not eating/ drinking well. At his baseline he is able to walk but needs some assistance with his ADL's  Allergies: Carvedilol  MEDICATIONS: 1. Flomax 0.4 mg a day.  2. Aspirin 81 mg daily.  3. Clonidine patch 0.2 mg every week.  4. Depakote 250 mg by mouth three times a day.  5. Exelon patch 4.6 mg transdermally daily.  6. Metoprolol 25 mg twice a day.  7. Namenda 5 mg twice a day.  8. Risperdal 0.5 mg twice a day.    PAST MEDICAL HISTORY: 1. Diabetes mellitus type 2.  2. Hyperlipidemia.   3. History of prostate cancer.   4. Hyperlipidemia.   5. Status post right inguinal hernia repair.   6. Mild aspiration pneumonia.   7. Moderate rhabdomyolysis with mild acute renal failure in Feb 2012.  8. Alzheimer's Dementia.  9.  Prostate cancer  status post XRT in 2009.    PAST SURGICAL HSITORY Status post right inguinal hernia repair in March 2010  SOCIAL HISTORY: . He is married currently resides at the nursing home  FAMILY HISTORY:  Remarkable for mother is dying at age 57.  Father   died at the age 60 of unknown causes.   Review of Systems: Pertinent items are noted in HPI.  VITALS: T-98.1, HR-86, BP-152/74> 125/79, R-20, O2 sats-95% on RA  Physical Exam:  General appearance: alert, mild distress from hip pain Lungs: clear to auscultation bilaterally CVS:RRR, S1 , S2 normal, no M/R/G Abdomen: soft, nontender, positive bowel sounds Extremities:no edema, clubbing or cyanosis Muskuloskeletal: left hip swelling present, left leg shortened and internally rotated. Neuro: unable to assess  Lab results:  TCO2                                     28                0-100            mmol/L  Ionized Calcium  1.13              1.12-1.32        mmol/L  Hemoglobin (HGB)                         12.6       l      13.0-17.0        g/dL  Hematocrit (HCT)                         37.0       l      39.0-52.0        %  Sodium (NA)                              139               135-145          mEq/L  Potassium (K)                            4.0               3.5-5.1          mEq/L  Chloride                                 102               96-112           mEq/L  Glucose                                  125        h      70-99            mg/dL  BUN                                      31         h      6-23             mg/dL  Creatinine                               1.60       h      0.4-1.5          mg/dL  Imaging results:  CT- head: 1.  Stable atrophy and moderate small vessel ischemic change.   2.  No acute intracranial abnormality.   3.  Improvement in right maxillary sinus disease with only mild   mucosal thickening remaining.  CT cervical spine: 1.  Straightened alignment with  degenerative disc disease.  No   acute cervical spine fracture.   2.  Degenerative disc disease at C4-5 and C5-6.  DG- Femur: Comminuted intertrochanteric left femoral neck fracture.  No   fractures elsewhere involving the femur.  Other results: Adenosine nuclear myoview in 03/2008, showed large prior distal anterior, distal inferior and apical infarct: minimal peri- infarct ischemia at the apex  Assessment & Plan by Problem:  1. Fall: Un witnessed fall , resulting in  left femoral neck fracture confirmed with clinical exam and imaging. He had extensive imaging including  CT- head, cervical spine, CXR and X- ray  pelvis that ruled any other fractures. He was evaluated by orthopedic surgeon Dr. Shelle Iron in the ER and would be undergoing intramedullary nailing of femur tonight( 5/8). Plan: Admit him to regular bed. Will get stat CBC, BMET, PT/INR, EKG. Will type and cross 2 units of PRBC. Will follow him post- op.  2.Chronic Renal failure:likely secondary to diabetes and HTN. His Cr is close to his baseline  Around 1.4- 1.5 Plan:  Gentle hydration with IV fluids. Will continue to monitor.  3. Type 2 DM: CBG  Well controlled. Will start SSI. Will continue to monitor.  4. HTN: BP fairly well controlled.  5. Alzheimer's Dementia: As per the daughter's , his mental status is declining for last one month. Hold home meds for now.  6. CAD: stable.He is being managed medically. Will hold metoprolol for now.   Note was left in Dr Jory Ee inbasket after her last day. I am signing the note to complete the process but was not involved in the pt's care.

## 2010-06-15 NOTE — H&P (Signed)
Hospital Admission Note Date: 06/15/2010  Patient name: [name] Medical record number: [id number] Date of birth: 1930/07/11 Age: 77 y.o. Gender: male PCP: Florentina Jenny, MD  Medical Service:Internal Medicine  Attending physician:  Dr. Anderson Malta     Resident [phone]):   Dr. Baltazar Apo                  Pager: [phone] Resident (R1):   Dr. Dorthula Rue                  Pager: [phone]  Chief Complaint: fall  History of Present Illness: 77 y/o man resident from [facility] with PMH significant for Type 2 DM, HTN, HLD, Alzheimer's dementia comes to the ER with chief complaint of fall. The history was obtained from the chart and daughters present at the bedside. Patient apparently had an witnessed fall while he was trying to dress up in the morning of a day prior to his admission around  6.30 AM. The daughter who was visiting him noticed that he was not acting himself and was in some distress. She requested some imaging at the nursing home, but the report was not read until today when it showed left hip fracture and patient was brought to the ER for further management. As per the daughter's , his mental status is declining for last one month and he is not eating/ drinking well. At his baseline he is able to walk but needs some assistance with his ADL's  Allergies: Carvedilol  MEDICATIONS: 1. Flomax 0.4 mg a day.  2. Aspirin 81 mg daily.  3. Exelon patch 4.6 mg transdermally daily.  6. Metoprolol 25 mg twice a day.    PAST MEDICAL HISTORY:  1. Diabetes mellitus type 2.   2. Hyperlipidemia.   3. History of prostate cancer.   4. Hyperlipidemia.   5. Status post right inguinal hernia repair. March 2012  6. Mild aspiration pneumonia. Feb 2012  7. Moderate rhabdomyolysis with mild acute renal failure in Feb 2012.  8. Alzheimer's Dementia.  9. Prostate cancer  status post XRT in 2009.    PAST SURGICAL HSITORY Status post right inguinal hernia repair in March 2010  SOCIAL HISTORY:  . He is married currently resides at the nursing home  FAMILY HISTORY:  Remarkable for mother is dying at age 31.  Father   died at the age 55 of unknown causes.   Review of Systems: Pertinent items are noted in HPI.  VITALS: T-98.1, HR-86, BP-152/74> 125/79, R-20, O2 sats-95% on RA  Physical Exam:  General appearance: alert, mild distress from hip pain Lungs: clear to auscultation bilaterally CVS:RRR, S1 , S2 normal, no M/R/G Abdomen: soft, nontender, positive bowel sounds Extremities:no edema, clubbing or cyanosis Muskuloskeletal: left hip swelling present, left leg shortened and internally rotated. Neuro: unable to assess  Lab results:    TCO2                                     28                0-100            mmol/L  Ionized Calcium                          1.13              1.12-1.32  mmol/L  Hemoglobin (HGB)                         12.6       l      13.0-17.0        g/dL  Hematocrit (HCT)                         37.0       l      39.0-52.0        %  Sodium (NA)                              139               135-145          mEq/L  Potassium (K)                            4.0               3.5-5.1          mEq/L  Chloride                                 102               96-112           mEq/L  Glucose                                  125        h      70-99            mg/dL  BUN                                      31         h      6-23             mg/dL  Creatinine                               1.60       h      0.4-1.5          mg/dL  Imaging results:   CT- head: 1.  Stable atrophy and moderate small vessel ischemic change.   2.  No acute intracranial abnormality.   3.  Improvement in right maxillary sinus disease with only mild   mucosal thickening remaining.  CT cervical spine: 1.  Straightened alignment with degenerative disc disease.  No   acute cervical spine fracture.   2.  Degenerative disc disease at C4-5 and C5-6.  DG- Femur: Comminuted  intertrochanteric left femoral neck fracture.  No   fractures elsewhere involving the femur.  Other results: Adenosine nuclear myoview in 03/2008, showed large prior distal anterior, distal inferior and apical infarct: minimal peri- infarct ischemia at the apex  Assessment & Plan by Problem:  1. Fall: Unwitnessed vs witnessed fall, resulting in left femoral neck fracture confirmed with clinical  exam and imaging. He had extensive imaging including  CT- head, cervical spine, CXR and X- ray  pelvis that ruled any other fractures. He was evaluated by orthopedic surgeon Dr. Shelle Iron in the ER and would be undergoing intramedullary nailing of femur tonight( 5/8). Plan: Admit him to regular bed. Will get stat CBC, BMET, PT/INR, EKG. Will type and cross 2 units of PRBC. Will follow him post- op. Pain control with morphine  2.Acute on Chronic Renal Insufficiency: likely pre-renal and dehydration with chronic insufficiency secondary to diabetes and HTN. His Cr is close to his baseline which is around 1.3-1.4 Plan:  Gentle hydration with IV fluids. Will continue to monitor.  3. Type 2 DM: CBG  Well controlled. Will start SSI. Will continue to monitor.  4. HTN: BP fairly well controlled.   5. Alzheimer's Dementia: As per the daughter's, his mental status is declining even further for last one month. Will continue Exelon patch. Per patient's family, based on patient's declining mental status and functional status, they would like to look into changing facilities and would like for pt to go to [facility]. Will place SW consult.   6. CAD: stable. He is being managed medically. Will continue metoprolol for now.  7. VTE Prophylaxis: Lovenox    Shawanna Zanders PGY-2            Elyse Jarvis PGY-1

## 2010-06-16 DIAGNOSIS — S0280XA Fracture of other specified skull and facial bones, unspecified side, initial encounter for closed fracture: Secondary | ICD-10-CM

## 2010-06-16 LAB — GLUCOSE, CAPILLARY: Glucose-Capillary: 120 mg/dL — ABNORMAL HIGH (ref 70–99)

## 2010-06-16 LAB — BASIC METABOLIC PANEL
BUN: 26 mg/dL — ABNORMAL HIGH (ref 6–23)
CO2: 29 mEq/L (ref 19–32)
Calcium: 8.2 mg/dL — ABNORMAL LOW (ref 8.4–10.5)
Chloride: 105 mEq/L (ref 96–112)
Creatinine, Ser: 1.29 mg/dL (ref 0.4–1.5)
Glucose, Bld: 116 mg/dL — ABNORMAL HIGH (ref 70–99)

## 2010-06-16 LAB — CBC
HCT: 29.3 % — ABNORMAL LOW (ref 39.0–52.0)
Hemoglobin: 9.4 g/dL — ABNORMAL LOW (ref 13.0–17.0)
MCH: 29.5 pg (ref 26.0–34.0)
MCHC: 32.1 g/dL (ref 30.0–36.0)
MCV: 91.8 fL (ref 78.0–100.0)
RBC: 3.19 MIL/uL — ABNORMAL LOW (ref 4.22–5.81)

## 2010-06-16 LAB — PROTIME-INR: Prothrombin Time: 15.3 seconds — ABNORMAL HIGH (ref 11.6–15.2)

## 2010-06-17 DIAGNOSIS — S72009A Fracture of unspecified part of neck of unspecified femur, initial encounter for closed fracture: Secondary | ICD-10-CM

## 2010-06-17 LAB — CBC
Hemoglobin: 8.9 g/dL — ABNORMAL LOW (ref 13.0–17.0)
RBC: 3 MIL/uL — ABNORMAL LOW (ref 4.22–5.81)
WBC: 5.3 10*3/uL (ref 4.0–10.5)

## 2010-06-17 LAB — BASIC METABOLIC PANEL
CO2: 30 mEq/L (ref 19–32)
Calcium: 8.2 mg/dL — ABNORMAL LOW (ref 8.4–10.5)
Chloride: 105 mEq/L (ref 96–112)
GFR calc Af Amer: >60 mL/min (ref >60)
Sodium: 140 mEq/L (ref 135–145)

## 2010-06-17 LAB — GLUCOSE, CAPILLARY: Glucose-Capillary: 123 mg/dL — ABNORMAL HIGH (ref 70–99)

## 2010-06-18 LAB — BASIC METABOLIC PANEL
BUN: 22 mg/dL (ref 6–23)
Chloride: 106 mEq/L (ref 96–112)
Potassium: 3.5 mEq/L (ref 3.5–5.1)
Sodium: 141 mEq/L (ref 135–145)

## 2010-06-18 LAB — CBC
HCT: 26.3 % — ABNORMAL LOW (ref 39.0–52.0)
MCV: 92.6 fL (ref 78.0–100.0)
Platelets: 189 10*3/uL (ref 150–400)
RBC: 2.84 MIL/uL — ABNORMAL LOW (ref 4.22–5.81)
WBC: 5 10*3/uL (ref 4.0–10.5)

## 2010-06-19 LAB — CBC
HCT: 25.7 % — ABNORMAL LOW (ref 39.0–52.0)
Hemoglobin: 8.5 g/dL — ABNORMAL LOW (ref 13.0–17.0)
MCV: 91.5 fL (ref 78.0–100.0)
RDW: 12.6 % (ref 11.5–15.5)
WBC: 5 10*3/uL (ref 4.0–10.5)

## 2010-06-21 DIAGNOSIS — W19XXXA Unspecified fall, initial encounter: Secondary | ICD-10-CM

## 2010-06-21 DIAGNOSIS — S72009A Fracture of unspecified part of neck of unspecified femur, initial encounter for closed fracture: Secondary | ICD-10-CM

## 2010-06-22 NOTE — Assessment & Plan Note (Signed)
[facility]                            CARDIOLOGY OFFICE NOTE   GIORDANO, GETMAN                   MRN:          [id number]  DATE:03/31/2008                            DOB:          12/26/1930    The patient was referred for preoperative clearance, hypertension,  hypercholesterolemia, and abnormal EKG.   Unfortunately, Jasai has significant dementia.  He actually got lost  in our building and was about 30 minutes late for his appointment.   His wife was with him, but she has difficulty ambulating because of her  knees and Aldine wondered off.  He almost wondered off at second time  waiting in our room.   He has severe dementia.  He saw Dr. Drue Novel on March 26, 2008 with a 77-  day-old right groin mass.  He needs hernia surgery.  He has never had a  previous heart problem.  However, history taking from [facility] is little  difficult as he seems to forget most details.  Coronary risk factors  include hypertension, hypercholesterolemia, and diabetes.  He says he  does not have sugar, but he clearly does and is on medication.   He denies any chest pain, PND, or orthopnea.  He ambulates without  difficulty.  He has not had palpitations or syncope.   PAST MEDICAL HISTORY:  Remarkable for hypertension, diabetes,  hyperlipidemia, and prostate cancer, status post XRT in 2009, severe  dementia with apparently some acting on behavioral issues.  Dr. Leta Jungling  note indicates coronary artery disease, but I see no mention of this,  the wife and Zyan.  He denied any history of coronary artery disease  and we do not have any old records on him outside of Dr. Leta Jungling EMR note.   REVIEW OF SYSTEMS:  The patient's review of systems remarkable for  occasional headaches, otherwise negative.  See HPI.  The patient  indicates that he smokes a pack a day.  He is married.  He is originally  from [place].  He is retired.  He does not drink excessively.   FAMILY  HISTORY:  Remarkable for mother is dying at age 65.  Father is  dying at the age 54 of unknown causes.   CURRENT MEDICATIONS:  1. Flomax 0.4 mg a day.  2. Lipitor 20 a day.  3. Metoprolol 25 b.i.d.  4. Accuretic 20/12.5 once a day.  5. Metformin 500 mg 2 tablets b.i.d.  6. Micardis 80 a day.  7. Aricept 5 a day.   He indicates an intolerance to COREG.   PHYSICAL EXAMINATION:  VITAL SIGNS:  Blood pressure in the office today  is 120/70.  His pulse was 71.  He was afebrile with respiratory rate of  14.  GENERAL:  Remarkable for an elderly [place] male, who is confused.  He is  not oriented to person, place, or time.  HEENT:  Unremarkable.  NECK:  Carotids are normal without bruit.  No lymphadenopathy,  thyromegaly, or JVP elevation.  LUNGS:  Clear.  Good diaphragmatic motion.  No wheezing.  CARDIAC:  S1 and S2.  Normal heart sounds.  PMI normal.  ABDOMEN:  Benign.  He does appear to have had a cholecystectomy.  No  AAA.  No tenderness.  No bruit.  No hepatosplenomegaly.  No  hepatojugular reflux or tenderness.  He has a right inguinal hernia.  EXTREMITIES:  Distal pulses are intact.  No edema.  NEUROLOGIC:  Nonfocal.  SKIN:  Warm and dry.  MUSCULOSKELETAL:  No muscular weakness.   His electrocardiogram shows sinus rhythm with occasional PVC, poor R-  wave progression, and suggestion of a previous anterior lateral wall MI.   IMPRESSION:  1. Preoperative clearance in an elderly diabetic with an abnormal EKG      suggesting anterolateral wall infarct.  Follow up adenosine      Myoview.  Continue aspirin, beta-blocker, and angiotensin-      converting enzyme inhibitor.  2. Hypertension, currently well controlled.  He should continue on his      beta-blocker right up until the time of surgery.  3. Diabetes.  Follow up with Dr. Drue Novel.  Continue oral hypoglycemics.      Hemoglobin A1c quarterly.  4. Dementia.  This is profound.  I think the patient is a little bit      of a threat to  hurt himself.  He clearly wonders away from his      wife, who cannot keep up with him.  I will leave it up Dr. Drue Novel      regarding home health needs and behavioral issues.  5. Right inguinal hernia, proceed with general surgical evaluation so      long as his adenosine Myoview does not show ischemia or marked      decrease in left ventricular function.  We will clear him for      surgery.     Noralyn Pick. Eden Emms, MD, [facility]  Electronically Signed    PCN/MedQ  DD: 03/31/2008  DT: 03/31/2008  Job #: [id number]   cc:   Willow Ora, MD

## 2010-06-22 NOTE — Op Note (Signed)
NAMEKARANVEER, RAMAKRISHNAN            ACCOUNT NO.:  [id number]   MEDICAL RECORD NO.:  [id number]          PATIENT TYPE:  AMB   LOCATION:  DAY                          FACILITY[facility]  [facility]   PHYSICIAN:  Thomas A. Cornett, M.D.DATE OF BIRTH:  [birth year]   DATE OF PROCEDURE:  04/16/2008  DATE OF DISCHARGE:                               OPERATIVE REPORT   PREOPERATIVE DIAGNOSIS:  Right inguinal hernia.   POSTOPERATIVE DIAGNOSIS:  Right inguinal hernia.   OPERATION/PROCEDURE:  Repair right inguinal hernia with mesh.   SURGEON:  Maisie Fus A. Cornett, M.D.   ASSISTANT:  OR staff.   ANESTHESIA:  LMA.  General endotracheal anesthesia, 0.25% Sensorcaine  local with epinephrine.   ESTIMATED BLOOD LOSS:  5 mL.   DRAINS:  None.   INDICATIONS FOR PROCEDURE:  The patient is a 77 year old male with a  symptomatic right inguinal hernia.  He wished to have it repaired today.   DESCRIPTION OF PROCEDURE:  The patient was brought to the operating  room, placed supine.  Induction of general anesthesia.  The right  inguinal region was prepped and draped in sterile fashion and 0.25%  Sensorcaine with epinephrine was used for local anesthesia.  Incision  was made in the right inguinal crease.  Dissection was carried down  through Scarpa fascia until the aponeurosis of the external oblique was  identified.  This was opened in the direction of its fibers.  Cord  structures were identified and preserved.  There is indirect hernia sac  that was peeled off the cord as well as a lipoma.  This was reduced back  in the preperitoneal space.  The ilioinguinal nerve was divided as it  exited the muscle.  A large Prolene hernia system was positioned with  the inner leaflet in the preperitoneal space.  The [place] was secured to  the shelving edge of the inguinal ligament with 2-0 Novofil.  A 0 Vicryl  was used to secure it to the pubis.  It was all secured to the conjoined  tendon and to the internal oblique medially  with 2-0 Novofil.  A slit  was cut for cord structures.  I could introduce the tip of my fifth  digit in this.  At this point in time the fascia was then closed with  the running 2-0 Vicryl.  A 3-0 Vicryl was used to approximate Scarpa  fascia. A 4-0  Monocryl was used to close the skin in septic fashion.  Dermabond was  applied.  All final counts of sponge, needle and instruments were found  be correct for this portion of the case.  The patient was awakened,  taken to recovery in satisfactory condition.      Thomas A. Cornett, M.D.  Electronically Signed     TAC/MEDQ  D:  04/16/2008  T:  04/17/2008  Job:  [id number]   cc:   [place] [address]

## 2010-06-22 NOTE — Discharge Summary (Signed)
[name], [name]            ACCOUNT NO.:  [id number]   MEDICAL RECORD NO.:  [id number]          PATIENT TYPE:  OIB   LOCATION:  1311                         FACILITY:  [facility]   PHYSICIAN:  Thomas A. Cornett, M.D.DATE OF BIRTH:  1930-09-20   DATE OF ADMISSION:  04/16/2008  DATE OF DISCHARGE:  04/17/2008                               DISCHARGE SUMMARY   ADMISSION DIAGNOSES:  1. Right inguinal hernia.  2. Alzheimer's disease.  3. Hypercholesterolemia.  4. Hypertension.   DISCHARGE DIAGNOSES:  1. Right inguinal hernia.  2. Alzheimer's disease.  3. Hypercholesterolemia.  4. Hypertension.   PROCEDURE PERFORMED:  Repair of right inguinal hernia with mesh.   BRIEF HISTORY:  Patient is a 77 year old male with dementia with a right  inguinal hernia.  He was add on 04/16/08 after repair of his right  inguinal hernia.   HOSPITAL COURSE:  Unremarkable.   He was discharged home in satisfactory condition on postop day #1.  His  dementia was not exacerbated.  His wound was clean, dry, and intact  without signs of bleeding.  His pain control was adequate.   DISCHARGE INSTRUCTIONS:  He will follow up in 3 to 4 weeks.  He will be  given a prescription for Vicodin 1 to 2 tabs q.4h. p.r.n. pain and will  resume his home medicines as outlined in the medical reconciliation  list.   CONDITION ON DISCHARGE:  Improved.      Thomas A. Cornett, M.D.  Electronically Signed     TAC/MEDQ  D:  04/17/2008  T:  04/17/2008  Job:  [id number]

## 2010-06-23 DIAGNOSIS — S72009A Fracture of unspecified part of neck of unspecified femur, initial encounter for closed fracture: Secondary | ICD-10-CM

## 2010-06-23 DIAGNOSIS — W19XXXA Unspecified fall, initial encounter: Secondary | ICD-10-CM

## 2010-06-25 NOTE — Cardiovascular Report (Signed)
[facility]  Patient:    [name], [name]                   MRN: [id number] Proc. Date: 08/10/99 Adm. Date:  [id number] Attending:  Veneda Melter CC:         Veneda Melter, M.D. [facility]             Titus Dubin. Alwyn Ren, M.D. [facility]             Dietrich Pates, M.D. [facility]                        Cardiac Catheterization  PROCEDURES PERFORMED: 1. Left heart catheterization. 2. Left ventriculogram. 3. Selective coronary angiography.  DIAGNOSES: 1. Severe single-vessel coronary artery disease. 2. Mild left ventricular systolic function.  INDICATIONS:  Mr. [name] is a 77 year old gentleman with diabetes mellitus and dyslipidemia, who presents with an abnormal ECG.  The patient underwent a Cardiolite stress test showing extensive scarring in the anterior, anterolateral and apical walls with mild inferior wall ischemia.  The patient presents now for left heart catheterization.  TECHNIQUE:  After informed consent was obtained, the patient was brought to the cardiac catheterization lab where both groins were sterilely prepped and draped.  Lidocaine 1% was used to infiltrate the right groin, and a 6 French sheath placed into the right femoral artery using the modified Seldinger technique.  The 6 [place] and JR4 catheters were then used to engage the left and right coronary arteries, and selective angiography was performed in various projections using manual injections of contrast.  A 6 French pigtail catheter was then advanced to the left ventricle and the left ventriculogram performed using power injections of contrast.  At the termination of the case, the catheters and sheaths were removed and manual pressure applied until adequate hemostasis was achieved.  The patient tolerated the procedure well and was transferred to the floor in stable condition.  FINDINGS:  Findings are as follows:  Left main trunk:  The left main trunk is a large caliber vessel, long  with mild irregularities.  Left anterior descending:  This begins as a medium caliber vessel and provides a large first septal perforator and a medium caliber first diagonal branch in the proximal segment.  The LAD is then occluded in the mid section.  The distal LAD is seen to fill via collaterals from the circumflex and RV marginal branch.  This appears to have severe diffuse disease in the mid section.  The distal LAD is of normal caliber and slightly underfilled.  However, it appears to have mild diffuse disease as it wraps around the apex.  Left circumflex artery:  This is a large caliber vessel that provides a small first marginal branch in its proximal segment to larger marginal branch in the mid section and the posterior descending artery in the distal section.  The AV circumflex has mild diffuse disease with narrowings of 50% just distal to the large second marginal branch.  The second marginal branch has narrowings of 30-40% in its proximal segment.  The remainder of the left circumflex system has mild diffuse disease.  Right coronary artery:  The right coronary artery is nondominant.  This is a medium caliber vessel that consists of a large RV marginal branch.  There is mild diffuse disease with narrowings of 30-40% in the mid section of the RV marginal branch.  LEFT VENTRICULOGRAM:  Mildly end-systolic and end-diastolic dimensions. Overall left  ventricular function is at least mildly impaired.  Ejection fraction of approximately 40%.  There is akinesis of the mid and distal anterior wall as well as the apex and there is no mitral regurgitation.  LV pressure is 150/10, the aortic is 150/77, LVEDP equals 21.  ASSESSMENT AND PLAN:  Mr. [name] is a 77 year old gentleman with an occluded left anterior descending and anterior wall akinesis by Cardiolite study.  He has no ischemia in the anterior apical and anterolateral walls.  Myocardial viability study would be helpful in  determining whether there is any salvageable myocardium and this may guide either surgical or percutaneous revascularization. DD:  08/10/99 TD:  08/10/99 Job: [id number]

## 2010-06-27 NOTE — Op Note (Signed)
NAMEHOLLISTER, WESSLER            ACCOUNT NO.:  [id number]  MEDICAL RECORD NO.:  [id number]           PATIENT TYPE:  I  LOCATION:  5023                         FACILITY:  [facility]  PHYSICIAN:  Jene Every, M.D.    DATE OF BIRTH:  10-18-1930  DATE OF PROCEDURE:  06/15/2010 DATE OF DISCHARGE:                              OPERATIVE REPORT   PREOPERATIVE DIAGNOSES:  Left intertrochanteric, subtrochanteric, proximal femur fracture.  POSTOPERATIVE DIAGNOSES:  Left intertrochanteric, subtrochanteric, proximal femur fracture.  PROCEDURE PERFORMED:  Intramedullary rodding, left femur, utilizing a cephalomedullary long trochanteric nail.  ANESTHESIA:  General.  ASSISTANT:  None.  BRIEF HISTORY:  A 77 year old who sustained the above-mentioned fracture and indicated for stabilization.  Risks and benefits discussed including bleeding, infection, damage to neurovascular structures, no change in symptoms, worsening of symptoms, need for repeat debridement, DVT, PE, anesthetic complications, etc.  TECHNIQUE:  The patient in supine position.  After induction of adequate general anesthesia and 1 gram of Kefzol, placed on the fracture table. Peroneal post well-padded.  Well leg in gentle flexion, external rotation, and abduction.  Well-padded left lower extremity, longitudinal traction, internal rotation.  The fracture was reduced under x-ray anatomically in the AP and lateral plane.  The hip, peritrochanteric region, and the thigh was prepped and draped in usual sterile fashion. Surgical incision was then made proximal to the trochanter. Subcutaneous tissue was dissected.  Bipolar electrocautery was utilized to achieve hemostasis.  Fascia lata identified and divided in line of the skin incision.  The tip of the trochanter was identified, a guidepin advanced into the intramedullary canal, overreamed, placed an intramedullary guide to the superior pole of the patella distally.  It was  measured at 360 mm in length.  We selected that rod with 1 mm in diameter.  We reamed to 13 mm which was suggested.  I then inserted the rod without difficulty, minimal impaction required and seated it in the appropriate position.  Then, with the external alignment guide, we made a small incision over the thigh and advanced a drill bit up to the femoral head and neck in the AP and lateral plane, overreamed and then reamed to a 90 after appropriate measurement, inserted a lag screw to 90 mm in length with excellent purchase.  This was inserted and it was compressed after release of the traction.  We then engaged a set screw proximally.  In the AP and lateral plane, anatomic reduction and fixation, appropriate placement of the compression screw, the external alignment jig was then removed.  We turned then distally and placed a distal locking screw.  Under C-arm, we made a small stab incision laterally, triangulating utilizing the triangulation guide and a radiolucent guide, drilled to the static lock measured it 42 mm in length, inserted the screw with excellent purchase in the AP and lateral plane and satisfactory.  Next, all wounds were copiously irrigated.  We repaired the fascia with 0 Vicryl interrupted figure-of-eight sutures, subcu with 2-0 Vicryl simple sutures.  Skin was reapproximated with staples.  The distal wounds were closed in a similar fashion.  The wounds were dressed sterilely.  He was removed  from the fracture table; good leg length, good rotation, good pulses.  He was then extubated and transported to recovery room in satisfactory condition.  The patient tolerated the procedure well.  No complications.  No assistant.  Blood loss was 50 mL.     Jene Every, M.D.     Cordelia Pen  D:  06/15/2010  T:  06/16/2010  Job:  [id number]  Electronically Signed by Jene Every M.D. on 06/27/2010 01:31:37 PM

## 2010-06-27 NOTE — Consult Note (Signed)
  [name], [name]            ACCOUNT NO.:  [id number]  MEDICAL RECORD NO.:  [id number]           PATIENT TYPE:  I  LOCATION:  5023                         FACILITY:  [facility]  PHYSICIAN:  Jene Every, M.D.    DATE OF BIRTH:  Jun 09, 1930  DATE OF CONSULTATION:  06/15/2010 DATE OF DISCHARGE:                                CONSULTATION   CHIEF COMPLAINT:  Left hip pain.  HISTORY:  A 77 year old male fell today onto his left hip sustaining intertrochanteric hip fracture.  He was seen in the emergency room, I was consulted for orthopedic evaluation.  The patient reports having tripped.  He has no numbness or tingling in the lower extremity, chest pain, or shortness of breath.  He is Hispanic.  His daughter is here interpreting.  He does speak [place].  PAST MEDICAL HISTORY: 1. Coronary artery disease. 2. Hypertension. 3. Diabetes.  PHYSICAL EXAMINATION:  VITAL SIGNS:  Stable. HEENT:  Within normal limits. COR:  Regular rate and rhythm. PULMONARY:  Clear to auscultation. ABDOMEN:  Soft, nontender. PELVIS:  Stable.  Nontender lumbar spine. EXTREMITIES:  Left lower extremity external rotation and shortening.  He has 1+ dorsalis pedis and posterior tibial pulse.  Shortened when externally rotated.  No evidence of DVT.  He is extending and flexing his toes.  Compartments are soft.  Upper extremities unremarkable.  LABORATORY DATA:  Chemistries are pending.  EKG is pending.  Chest x-ray, no active disease.  Hip x-ray demonstrates comminuted intertrochanteric hip fracture.  IMPRESSION: 1. Comminuted intertrochanteric hip fracture. 2. History of coronary artery disease. 3. Hypertension. 4. Diabetes.  PLAN:  Intramedullary nailing of left hip after open reduction and internal fixation.  We had preoperative clearance by medical team and discussed this with Dr. Olena Leatherwood.  She feels it will be appropriate to proceed.  Does have some elevated baseline creatinine of 1.6,  which he did in March 2010. We will obtain stat blood chemistries and proceed accordingly. Discussed risks and benefits including bleeding, infection, malunion, nonunion, need for revision, DVT, PE, anesthetic complications, etc.     Jene Every, M.D.     [name]  D:  06/15/2010  T:  06/16/2010  Job:  [id number]  Electronically Signed by Jene Every M.D. on 06/27/2010 01:31:34 PM

## 2010-06-28 DIAGNOSIS — X58XXXA Exposure to other specified factors, initial encounter: Secondary | ICD-10-CM

## 2010-06-28 DIAGNOSIS — S72009A Fracture of unspecified part of neck of unspecified femur, initial encounter for closed fracture: Secondary | ICD-10-CM

## 2010-07-12 NOTE — Discharge Summary (Signed)
[name], [name]            ACCOUNT NO.:  [id number]  MEDICAL RECORD NO.:  [id number]           PATIENT TYPE:  I  LOCATION:  4525                         FACILITY:  [facility]  PHYSICIAN:  Blanch Media, M.D.DATE OF BIRTH:  [birth year]  DATE OF ADMISSION:  06/15/2010 DATE OF DISCHARGE:  06/24/2010                              DISCHARGE SUMMARY   DISCHARGE DIAGNOSES: 1. Fall.     a.     Left hip fracture, status post intramedullary rodding of      left femur.     b.     Left lateral 7th and 8th rib fractures. 2. Advanced Alzheimer dementia. 3. Type 2 diabetes mellitus. 4. Hyperlipidemia. 5. History of prostrate cancer. 6. Status post right inguinal hernia repair in March 2012. 7. Mild aspiration pneumonia in February 2012. 8. Moderate rhabdomyolysis with mild acute renal failure in February     2012. 9. Prostate cancer, status post radiotherapy in 2009.  DISCHARGE MEDICATIONS WITH ACCURATE DOSES: 1. Risperidone 0.5 mg take 1 tablet by mouth twice daily. 2. Tamsulosin 0.4 mg take 1 tablet by mouth daily. 3. Metoprolol 25 mg take 1 tablet by mouth twice daily. 4. Tramadol 50 mg take 1 tablet by mouth every 6 hours as needed for     pain. 5. Tylenol 325 kg take on Cardizem by mouth every 6 hours as needed     for pain.  DISPOSITION AND FOLLOWUP:  The patient was discharged to a [facility] nursing facility in stable and improved condition from [name] on Jun 24, 2010.  The patient needs to get palliative care consult at the facility. 1. CT of head which did not showed stable atrophy and moderate small     vessel disease.  No acute intracranial abnormality. 2. CT of cervical spine which showed degenerative disk disease at C4-5     and C5-6 with no acute cervical spine fracture. 3. X-ray of femur which showed comminuted intertrochanteric left     humeral neck fracture.  No fracture elsewhere.[name] 4. X-ray of pelvis which showed no fracture in the pelvis. 5.  Chest x-ray which showed acute left lateral 7th and 8th rib     fracture. 6. Intramedullary rodding of left femur status post intertrochanteric     fracture on Jun 15, 2010, by Dr. Jene Every.  CONSULTATIONS:  Orthopedics for left hip fracture.  BRIEF ADMITTING HISTORY AND PHYSICAL/HISTORY OF PRESENT ILLNESS:  A 77- year-old man visiting from [name] with past medical history significant for type 2 diabetes, hypertension, hyperlipidemia, and Alzheimer dementia come to the ER with chief complaint of fall.  The history was obtained from the chart and daughter who is at the bedside. The patient apparently had unwitnessed fall while he was trying to dress up in the morning of the day of admission at 6:30 a.m. when he was trying to put both the legs in the same side of the pant.  The daughter who was visiting him noticed that he was not acting himself and was in some distress.  She requested some imaging at the nursing home, but report was not read until today  when it showed left hip fracture and the patient was brought to the ER for further management.  As the patient's daughter, his mental status has been declining for last 1 month and not eating and drinking well.  She states at his baseline he is able to walk, but needs some assistance with ADLs.  PHYSICAL EXAMINATION:  VITAL SIGNS:  Temperature 98.1, heart rate 86, blood pressure 152/74, respiratory rate 20, and O2 sats 95% on room air. GENERAL:  Alert, mild distress from hip pain. LUNGS:  Clear to auscultation bilaterally. CARDIOVASCULAR:  Regular rate and rhythm.  S1 and S2 normal.  No murmurs, rubs, or gallops. ABDOMEN:  Soft and nontender.  Positive bowel sounds. EXTREMITIES:  No edema, clubbing, or cyanosis. MUSCULOSKELETAL:  Left hip swelling present, left leg shortened and internally rotated. NEURO:  Unable to assess.  ADMITTING LABORATORY DATA:  Hemoglobin 11.1, hematocrit 33.7, white cell count 6.9, and  platelets of 226.  Sodium 140, potassium 4.1, chloride 102, bicarb 30, glucose 133, BUN 29, and creatinine 1.3.  HOSPITAL COURSE BY PROBLEM: 1. Fall.  The patient presented with fall at SNF resulting in left     femoral neck fracture and 7th and 8th left lateral rib fractures.     He had extensive imaging including CT of head, cervical spine, x-     ray of pelvis that ruled out any other fractures.  For his left hip     fractures, he underwent intramedullary nailing by Dr. Shelle Iron on the     day of his admission.  The procedure was successful without any     complication.  The patient was managed symptomatically for his pain     with morphine throughout his hospital stay. 2. Acute on chronic renal failure which was likely prerenal secondary     to dehydration.  With gentle hydration, the patient was back to his     baseline. 3. Rectal bleeding.  The patient's hospital course was complicated by     an episode of rectal bleeding which was likely secondary to the     Lovenox that he was getting as a DVT prophylaxis for his hip     fracture.  The patient's bleeding episode was resolved     spontaneously without any intervention.  His lovenox was held and he       had no further episodes of bleeding. 4. Palliative care.  Given his advanced dementia, age, and hip     fracture, we decided to have goals of care meeting with the family,     explaining and discussing his prognosis.  After that goals of care     meeting, it was decided that the patient needs to be on full     comfort care.  Subsequently, no further labs were drawn and the     patient was managed symptomatically for his pain and agitation with     morphine, Ativan, and risperidone.  The family decided that the     patient should not be given any antibiotics, diabetes medications,     or any other acute medical intervention.  They just wanted the     patient to be treated symptomatically.  We did fill out the MOST     form with his  daughter clearing stating that they do not want their     father to be treated with antibiotics or metformin and comfort     measures needed to be continued.  DISCHARGE LABORATORY DATA AND  VITAL SIGNS:  Discharge vitals include temperature 98.2, pulse 80, respiratory rate 18, blood pressure 162/91, and O2 sats 99% on room air.  Discharge labs include CBC, hemoglobin 8.5, hematocrit 25.7, platelets 210, white count 5 from Jun 19, 2010, and from Jun 18, 2010, BMETs include sodium 141, potassium 3.5, chloride 106, bicarb 31, glucose 109, BUN 22, creatinine 1.    ______________________________ Elyse Jarvis, MD   ______________________________ Blanch Media, M.D.    MS/MEDQ  D:  06/24/2010  T:  06/24/2010  Job:  [id number]  cc:   Drue Dun. [employer], MD  Electronically Signed by Elyse Jarvis MD on 07/05/2010 02:46:03 PM Electronically Signed by Blanch Media M.D. on 07/12/2010 12:03:41 PM

## 2010-08-09 ENCOUNTER — Encounter: Payer: Self-pay | Admitting: Podiatrist

## 2010-09-08 DEATH — deceased

## 2011-11-18 IMAGING — CR DG CHEST 1V PORT
1 series · 1 of 1 positions shown · non-contrast
Comparison: 04/05/2010

CLINICAL DATA: No trauma, fall, chest pain

PORTABLE CHEST - 1 VIEW

[AP]
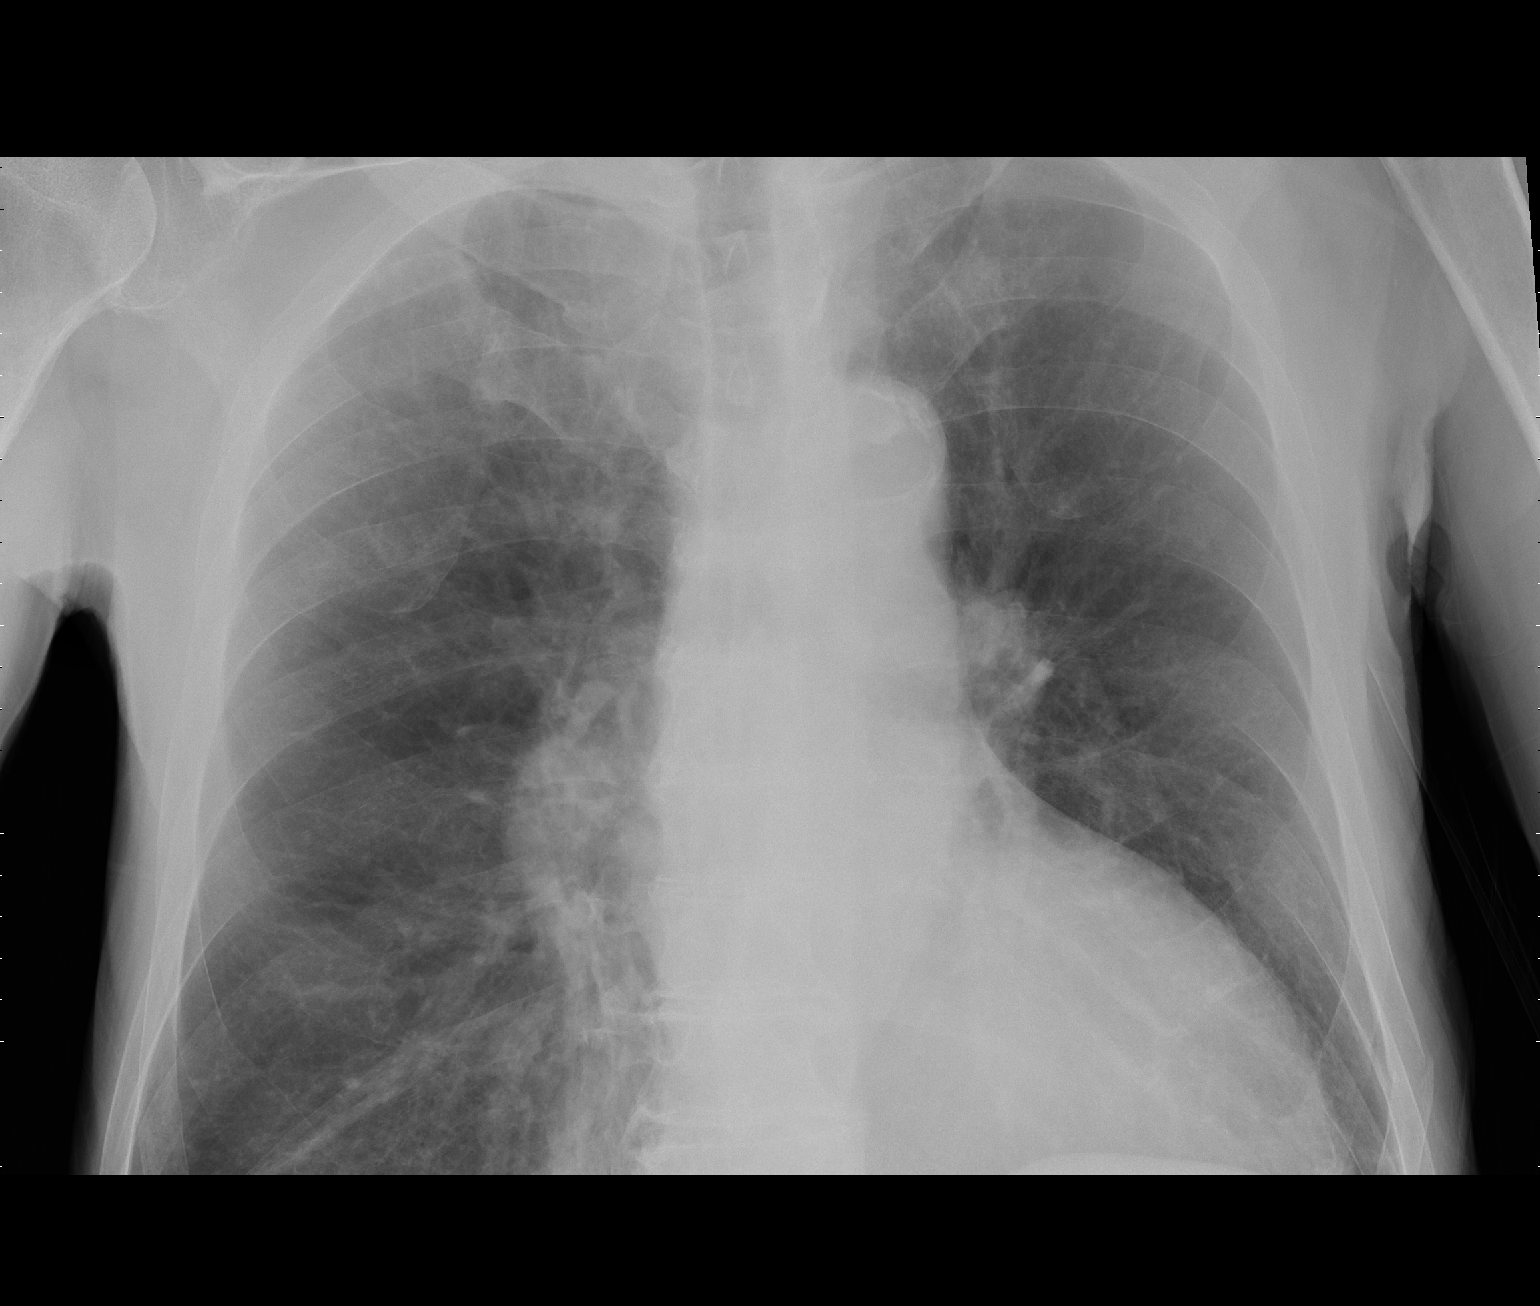

[1 of 1 positions shown; findings below may reference images not displayed]

FINDINGS: Heart is enlarged with vascular congestion and
hyperinflation.  Background COPD/emphysema suspected.  No effusion
or pneumothorax.  Left lateral seventh and eighth rib fractures
noted.  These appear acute.
IMPRESSION: Cardiomegaly with vascular congestion
Acute left lateral seventh and eighth rib fractures
No pneumothorax

## 2013-11-27 ENCOUNTER — Telehealth: Payer: Self-pay

## 2013-11-27 NOTE — Telephone Encounter (Signed)
Patient died per Social Security Death Site
